# Patient Record
Sex: Male | Born: 2000 | Race: Black or African American | Hispanic: No | Marital: Single | Smoking: Former smoker
Health system: Southern US, Community
[De-identification: ages and names within clinical notes are randomized; demographics above are authoritative.]

---
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
## 2020-11-15 ENCOUNTER — Encounter (HOSPITAL_COMMUNITY): Payer: Self-pay

## 2020-11-15 ENCOUNTER — Other Ambulatory Visit: Payer: Self-pay

## 2020-11-15 ENCOUNTER — Emergency Department (HOSPITAL_COMMUNITY)
Admission: EM | Admit: 2020-11-15 | Discharge: 2020-11-15 | Disposition: A | Discharge status: Home / Self Care | Attending: Emergency Medicine | Admitting: Emergency Medicine

## 2020-11-15 DIAGNOSIS — J029 Acute pharyngitis, unspecified: Secondary | ICD-10-CM | POA: Diagnosis present

## 2020-11-15 DIAGNOSIS — J069 Acute upper respiratory infection, unspecified: Secondary | ICD-10-CM

## 2020-11-15 DIAGNOSIS — Z20822 Contact with and (suspected) exposure to covid-19: Secondary | ICD-10-CM | POA: Diagnosis not present

## 2020-11-15 LAB — RESP PANEL BY RT-PCR (FLU A&B, COVID) ARPGX2
Influenza A by PCR: NEGATIVE
Influenza B by PCR: NEGATIVE
SARS Coronavirus 2 by RT PCR: NEGATIVE

## 2020-11-15 NOTE — ED Provider Notes (Signed)
[facility] EMERGENCY DEPARTMENT Provider Note   CSN: [id number] Arrival date & time: 11/15/20  1320     History Chief Complaint  Patient presents with  . Generalized Body Aches    Dennis Phillips is a 20 y.o. male.  Patient c/o sore throat, mild body aches, occasional non prod cough in the past 1-2 days. Denies specific known ill contact or known strep, flu, mono, or covid exposure. Denies trouble breathing or swallowing. No chest pain or discomfort. No sob. No abd pain or nvd. No dysuria. No headache. No fever/chills. Also request std check, as recent new contact. Denies known std exposure. No penile discharge. Requests swab be sent.   The history is provided by the patient.       History reviewed. No pertinent past medical history.  There are no problems to display for this patient.   History reviewed. No pertinent surgical history.     History reviewed. No pertinent family history.  Social History   Tobacco Use  . Smoking status: Never Smoker  . Smokeless tobacco: Never Used  Vaping Use  . Vaping Use: Never used  Substance Use Topics  . Alcohol use: No  . Drug use: No    Home Medications Prior to Admission medications   Medication Sig Start Date End Date Taking? Authorizing Provider  HYDROcodone-acetaminophen (NORCO/VICODIN) 5-325 MG per tablet Take 1 tablet by mouth every 4 (four) hours as needed for pain. Patient not taking: Reported on 05/25/2016 03/05/13   Burgess Amor, PA-C  ibuprofen (ADVIL,MOTRIN) 200 MG tablet Take 200 mg by mouth every 6 (six) hours as needed for pain.    [provider]    Allergies    Patient has no known allergies.  Review of Systems   Review of Systems  Constitutional: Negative for chills and fever.  HENT: Positive for sore throat. Negative for sinus pain.   Eyes: Negative for redness.  Respiratory: Positive for cough. Negative for shortness of breath.   Cardiovascular: Negative for chest pain.  Gastrointestinal:  Negative for diarrhea and vomiting.  Genitourinary: Negative for dysuria, penile discharge and testicular pain.  Musculoskeletal: Positive for myalgias.  Skin: Negative for rash.  Neurological: Negative for headaches.    Physical Exam Updated Vital Signs BP 114/67 (BP Location: Right Arm)   Pulse 75   Temp 98.4 F (36.9 C) (Oral)   Resp 18   Ht 1.854 m (6\' 1" )   Wt 74.8 kg   SpO2 100%   BMI 21.77 kg/m   Physical Exam Vitals and nursing note reviewed.  Constitutional:      Appearance: Normal appearance. He is well-developed.  HENT:     Head: Atraumatic.     Right Ear: Tympanic membrane normal.     Left Ear: Tympanic membrane normal.     Nose: Nose normal.     Mouth/Throat:     Mouth: Mucous membranes are moist.     Pharynx: Oropharynx is clear. No oropharyngeal exudate or posterior oropharyngeal erythema.  Eyes:     General: No scleral icterus.    Conjunctiva/sclera: Conjunctivae normal.  Neck:     Trachea: No tracheal deviation.  Cardiovascular:     Rate and Rhythm: Normal rate and regular rhythm.     Pulses: Normal pulses.     Heart sounds: Normal heart sounds. No murmur heard. No friction rub. No gallop.   Pulmonary:     Effort: Pulmonary effort is normal. No accessory muscle usage or respiratory distress.  Breath sounds: Normal breath sounds.  Abdominal:     Palpations: Abdomen is soft.     Tenderness: There is no abdominal tenderness.  Genitourinary:    Comments: Normal external gu exam. No discharge.  Musculoskeletal:        General: No swelling.     Cervical back: Normal range of motion and neck supple. No rigidity.  Lymphadenopathy:     Cervical: No cervical adenopathy.  Skin:    General: Skin is warm and dry.     Findings: No rash.  Neurological:     Mental Status: He is alert.     Comments: Alert, speech clear.   Psychiatric:        Mood and Affect: Mood normal.     ED Results / Procedures / Treatments   Labs (all labs ordered are  listed, but only abnormal results are displayed) Labs Reviewed  RESP PANEL BY RT-PCR (FLU A&B, COVID) [id number]  GC/CHLAMYDIA PROBE AMP ([place]) NOT AT [facility]    EKG None  Radiology No results found.  Procedures Procedures   Medications Ordered in ED Medications - No data to display  ED Course  I have reviewed the triage vital signs and the nursing notes.  Pertinent labs & imaging results that were available during my care of the patient were reviewed by me and considered in my medical decision making (see chart for details).    MDM Rules/Calculators/A&P                         Pt with uri symptoms, no definite known covid exposure. Swab sent.   Dennis Phillips was evaluated in Emergency Department on 11/15/2020 for the symptoms described in the history of present illness. He was evaluated in the context of the global COVID-19 pandemic, which necessitated consideration that the patient might be at risk for infection with the SARS-CoV-2 virus that causes COVID-19. Institutional protocols and algorithms that pertain to the evaluation of patients at risk for COVID-19 are in a state of rapid change based on information released by regulatory bodies including the CDC and federal and state organizations. These policies and algorithms were followed during the patient's care in the ED.  Pt also requests urethral swab/std check - swab sent.   Pt appears stable for d/c.   Return precautions provided.    Final Clinical Impression(s) / ED Diagnoses Final diagnoses:  None    Rx / DC Orders ED Discharge Orders    None       Cathren Laine, MD 11/15/20 1431

## 2020-11-15 NOTE — Discharge Instructions (Addendum)
It was our pleasure to provide your ER care today - we hope that you feel better.  You may take acetaminophen or ibuprofen as need. You may use throat lozenges as need for symptom relief.   Your swabs were sent.  The STD tests should be resulted tomorrow - you may check MyChart or call for results.  The covid/flu test should be  resulted in the next 2-3 hours - check MyChart or call for result.   Return to ER if worse, new symptoms, trouble breathing, unable to swallow, high fevers, new/severe pain, or other concern.

## 2020-11-15 NOTE — ED Triage Notes (Signed)
Pt to er, pt states that he has flu like symptoms and he is sexually active, states that he wants to get checked for a std, states that he has no discharge, pt foul smelling urine, and no pain with urination.  Pt states that his general body aches, sore throat and muscle aches started yesterday after leaving the gym, pt denies cough, resps even and unlabored, talking in full sentences, no nasal drainage noted.

## 2020-11-16 LAB — GC/CHLAMYDIA PROBE AMP (~~LOC~~) NOT AT ARMC
Chlamydia: NEGATIVE
Comment: NEGATIVE
Comment: NORMAL
Neisseria Gonorrhea: NEGATIVE

## 2020-12-04 DIAGNOSIS — R109 Unspecified abdominal pain: Secondary | ICD-10-CM | POA: Insufficient documentation

## 2020-12-04 DIAGNOSIS — N433 Hydrocele, unspecified: Secondary | ICD-10-CM | POA: Insufficient documentation

## 2020-12-04 DIAGNOSIS — Y9367 Activity, basketball: Secondary | ICD-10-CM | POA: Diagnosis not present

## 2020-12-04 DIAGNOSIS — N50812 Left testicular pain: Secondary | ICD-10-CM | POA: Diagnosis not present

## 2020-12-04 DIAGNOSIS — N442 Benign cyst of testis: Secondary | ICD-10-CM | POA: Insufficient documentation

## 2020-12-04 DIAGNOSIS — X58XXXA Exposure to other specified factors, initial encounter: Secondary | ICD-10-CM | POA: Diagnosis not present

## 2020-12-05 ENCOUNTER — Other Ambulatory Visit (HOSPITAL_COMMUNITY)

## 2020-12-05 ENCOUNTER — Other Ambulatory Visit: Payer: Self-pay

## 2020-12-05 ENCOUNTER — Emergency Department (HOSPITAL_COMMUNITY)

## 2020-12-05 ENCOUNTER — Encounter (HOSPITAL_COMMUNITY): Payer: Self-pay | Admitting: Emergency Medicine

## 2020-12-05 ENCOUNTER — Emergency Department (HOSPITAL_COMMUNITY)
Admission: EM | Admit: 2020-12-05 | Discharge: 2020-12-05 | Disposition: A | Discharge status: Home / Self Care | Source: Home / Self Care | Attending: Emergency Medicine | Admitting: Emergency Medicine

## 2020-12-05 ENCOUNTER — Emergency Department (HOSPITAL_COMMUNITY)
Admission: EM | Admit: 2020-12-05 | Discharge: 2020-12-05 | Disposition: A | Discharge status: Home / Self Care | Attending: Emergency Medicine | Admitting: Emergency Medicine

## 2020-12-05 DIAGNOSIS — N442 Benign cyst of testis: Secondary | ICD-10-CM | POA: Insufficient documentation

## 2020-12-05 DIAGNOSIS — N433 Hydrocele, unspecified: Secondary | ICD-10-CM | POA: Insufficient documentation

## 2020-12-05 DIAGNOSIS — L729 Follicular cyst of the skin and subcutaneous tissue, unspecified: Secondary | ICD-10-CM

## 2020-12-05 DIAGNOSIS — R109 Unspecified abdominal pain: Secondary | ICD-10-CM | POA: Insufficient documentation

## 2020-12-05 DIAGNOSIS — N50812 Left testicular pain: Secondary | ICD-10-CM

## 2020-12-05 LAB — URINALYSIS, ROUTINE W REFLEX MICROSCOPIC
Bilirubin Urine: NEGATIVE
Bilirubin Urine: NEGATIVE
Glucose, UA: NEGATIVE mg/dL
Glucose, UA: NEGATIVE mg/dL
Hgb urine dipstick: NEGATIVE
Hgb urine dipstick: NEGATIVE
Ketones, ur: NEGATIVE mg/dL
Ketones, ur: NEGATIVE mg/dL
Leukocytes,Ua: NEGATIVE
Leukocytes,Ua: NEGATIVE
Nitrite: NEGATIVE
Nitrite: NEGATIVE
Protein, ur: NEGATIVE mg/dL
Protein, ur: NEGATIVE mg/dL
Specific Gravity, Urine: 1.018 (ref 1.005–1.030)
Specific Gravity, Urine: 1.016 (ref 1.005–1.030)
pH: 6 (ref 5.0–8.0)
pH: 7 (ref 5.0–8.0)

## 2020-12-05 MED ORDER — OXYCODONE-ACETAMINOPHEN 5-325 MG PO TABS
2.0000 | ORAL_TABLET | Freq: Once | ORAL | Status: AC
  Administered 2020-12-05: 2 via ORAL
  Filled 2020-12-05: qty 2

## 2020-12-05 MED ORDER — IBUPROFEN 600 MG PO TABS: 600.0000 mg | ORAL_TABLET | Freq: Four times a day (QID) | ORAL | 0 refills | Status: DC | PRN

## 2020-12-05 MED ORDER — SIMETHICONE 80 MG PO CHEW
80.0000 mg | CHEWABLE_TABLET | Freq: Once | ORAL | Status: AC
  Administered 2020-12-05: 80 mg via ORAL
  Filled 2020-12-05: qty 1

## 2020-12-05 MED ORDER — KETOROLAC TROMETHAMINE 60 MG/2ML IM SOLN
30.0000 mg | Freq: Once | INTRAMUSCULAR | Status: AC
  Administered 2020-12-05: 30 mg via INTRAMUSCULAR
  Filled 2020-12-05: qty 2

## 2020-12-05 NOTE — ED Triage Notes (Signed)
Pt states he was playing basketball and thinks he might have gotten hit in his L testicle. Pt states he has had pain off and on all day but that it has gotten worse. Pt states he is also now having mid abdominal pain with it.

## 2020-12-05 NOTE — ED Provider Notes (Signed)
[facility] EMERGENCY DEPARTMENT Provider Note   CSN: [id number] Arrival date & time: 12/04/20  2349     History Chief Complaint  Patient presents with  . Testicle Pain    Dennis Phillips is a 20 y.o. male.  20 year old male that presents the emerge department today for testicular pain.  States that he was playing basketball today and after he got done he noticed that his left testicle hurt.  He states that it was not severe in nature.  Went home and had a bowel movement and afterwards the testicle stopped hurting.  Patient went to sleep and he woke up to go to the bathroom and while he was awake he noticed that his left testicle is hurting again so he came here for further evaluation.  No urinary symptoms.  No fever.  No dysuria.  No history of the same.  Patient does not recall being hit or twisted his testicle in any [place] while playing basketball.  Does not recall overexerting himself.  No changes in bowel movements.   Testicle Pain       History reviewed. No pertinent past medical history.  There are no problems to display for this patient.   History reviewed. No pertinent surgical history.     History reviewed. No pertinent family history.  Social History   Tobacco Use  . Smoking status: Never Smoker  . Smokeless tobacco: Never Used  Vaping Use  . Vaping Use: Never used  Substance Use Topics  . Alcohol use: No  . Drug use: No    Home Medications Prior to Admission medications   Medication Sig Start Date End Date Taking? Authorizing Provider  HYDROcodone-acetaminophen (NORCO/VICODIN) 5-325 MG per tablet Take 1 tablet by mouth every 4 (four) hours as needed for pain. Patient not taking: Reported on 05/25/2016 03/05/13   Burgess Amor, PA-C  ibuprofen (ADVIL,MOTRIN) 200 MG tablet Take 200 mg by mouth every 6 (six) hours as needed for pain.    [provider]    Allergies    Patient has no known allergies.  Review of Systems   Review of Systems   Genitourinary: Positive for testicular pain.  All other systems reviewed and are negative.   Physical Exam Updated Vital Signs BP 120/74   Pulse (!) 56   Temp 98 F (36.7 C)   Resp 17   Ht 6\' 1"  (1.854 m)   Wt 74.8 kg   SpO2 99%   BMI 21.77 kg/m   Physical Exam Vitals and nursing note reviewed.  Constitutional:      Appearance: He is well-developed.  HENT:     Head: Normocephalic and atraumatic.     Mouth/Throat:     Mouth: Mucous membranes are moist.     Pharynx: Oropharynx is clear.  Eyes:     Pupils: Pupils are equal, round, and reactive to light.  Cardiovascular:     Rate and Rhythm: Normal rate.  Pulmonary:     Effort: Pulmonary effort is normal. No respiratory distress.  Abdominal:     General: Abdomen is flat. There is no distension.  Genitourinary:    Testes: Normal. Cremasteric reflex is present.        Right: Mass, tenderness, swelling, testicular hydrocele or varicocele not present.        Left: Mass, tenderness, swelling, testicular hydrocele or varicocele not present.     Epididymis:     Right: Normal.     Left: Normal.     Comments: Chaperoned by  nurse - Ginger Knot on perineal area non-erythematous, non tender, non warm, non fluctuant. Musculoskeletal:        General: Normal range of motion.     Cervical back: Normal range of motion.  Skin:    General: Skin is warm and dry.     Coloration: Skin is not jaundiced or pale.  Neurological:     General: No focal deficit present.     Mental Status: He is alert.     ED Results / Procedures / Treatments   Labs (all labs ordered are listed, but only abnormal results are displayed) Labs Reviewed  URINE CULTURE  URINALYSIS, ROUTINE W REFLEX MICROSCOPIC    EKG None  Radiology No results found.  Procedures Procedures   Medications Ordered in ED Medications  ketorolac (TORADOL) injection 30 mg (30 mg Intramuscular Given 12/05/20 0216)  oxyCODONE-acetaminophen (PERCOCET/ROXICET) 5-325 MG per  tablet 2 tablet (2 tablets Oral Given 12/05/20 0216)  simethicone (MYLICON) chewable tablet 80 mg (80 mg Oral Given 12/05/20 0216)    ED Course  I have reviewed the triage vital signs and the nursing notes.  Pertinent labs & imaging results that were available during my care of the patient were reviewed by me and considered in my medical decision making (see chart for details).    MDM Rules/Calculators/A&P                          No evidence of epididymitis, hydrocele, varicocele, hernia or testicular torsion.  outpatient [place] for the abnormal area in perineum, testicular US to ensure no e/o mass or other acute causes but not needed for torsion, so will get done as outpatient.   Final Clinical Impression(s) / ED Diagnoses Final diagnoses:  Pain in left testicle    Rx / DC Orders ED Discharge Orders         Ordered    US Scrotum        12/05/20 0345    US PELVIC DOPPLER (TORSION R/O OR MASS ARTERIAL FLOW)        12/05/20 0345           Awilda Covin, Barbara Cower, MD 12/05/20 [phone]

## 2020-12-05 NOTE — Discharge Instructions (Signed)
You can take ibuprofen 600 mg every 6 hours as needed for pain for the next 5-7 days.

## 2020-12-05 NOTE — ED Triage Notes (Signed)
Pt states he was playing basketball and thinks he might have gotten hit in his L testicle. Pt states he has had pain off and on all day but that it has gotten worse. Pt states he is also now having mid abdominal pain with it.  Pt wants sti screening and also has a "bump" in his groin area

## 2020-12-05 NOTE — ED Provider Notes (Signed)
[facility] EMERGENCY DEPARTMENT Provider Note   CSN: [id number] Arrival date & time: 12/05/20  1830     History Chief Complaint  Patient presents with  . Groin Injury    Dennis Phillips is a 21 y.o. male 20 year old male who presents emergency department with chief complaint of left testicle pain.  He was seen yesterday for the same during the evening was unable to obtain an ultrasound of the scrotum.  Patient complains of pain in the left testicle that began during a basketball game yesterday.  He states that it progressively worsened radiating up into his abdomen.  He took Advil and went to sleep and woke up and pain was still there.  He made a bowel movement and it relieved his pain somewhat but it came back.  He describes the pain is constant, aching.  It is now 4 out of 5 but yesterday was much more severe.  He also has a bump on the perineal area which is tender.  He noticed that just yesterday as well.  He denies hematuria, flank pain, nausea, vomiting.  HPI     History reviewed. No pertinent past medical history.  There are no problems to display for this patient.   History reviewed. No pertinent surgical history.     History reviewed. No pertinent family history.  Social History   Tobacco Use  . Smoking status: Never Smoker  . Smokeless tobacco: Never Used  Vaping Use  . Vaping Use: Never used  Substance Use Topics  . Alcohol use: No  . Drug use: No    Home Medications Prior to Admission medications   Medication Sig Start Date End Date Taking? Authorizing Provider  HYDROcodone-acetaminophen (NORCO/VICODIN) 5-325 MG per tablet Take 1 tablet by mouth every 4 (four) hours as needed for pain. Patient not taking: Reported on 05/25/2016 03/05/13   Burgess Amor, PA-C  ibuprofen (ADVIL,MOTRIN) 200 MG tablet Take 200 mg by mouth every 6 (six) hours as needed for pain.    [provider]    Allergies    Patient has no known allergies.  Review of Systems    Review of Systems Ten systems reviewed and are negative for acute change, except as noted in the HPI.   Physical Exam Updated Vital Signs BP 115/70 (BP Location: Right Arm)   Pulse (!) 45   Temp 98.2 F (36.8 C) (Oral)   Resp 18   Ht 6\' 1"  (1.854 m)   Wt 74.8 kg   SpO2 100%   BMI 21.77 kg/m   Physical Exam Vitals and nursing note reviewed.  Constitutional:      General: He is not in acute distress.    Appearance: He is well-developed. He is not diaphoretic.  HENT:     Head: Normocephalic and atraumatic.  Eyes:     General: No scleral icterus.    Conjunctiva/sclera: Conjunctivae normal.  Cardiovascular:     Rate and Rhythm: Normal rate and regular rhythm.     Heart sounds: Normal heart sounds.  Pulmonary:     Effort: Pulmonary effort is normal. No respiratory distress.     Breath sounds: Normal breath sounds.  Abdominal:     Palpations: Abdomen is soft.     Tenderness: There is abdominal tenderness in the left lower quadrant.  Genitourinary:    Comments: Exam chaperoned.  Normal male anatomy was circumcised penis.  No significant tenderness to palpation of the testicle, no palpable hernia.  No penile discharge, normal cremasteric reflex.  Palpable cyst on the perineum. Musculoskeletal:     Cervical back: Normal range of motion and neck supple.  Skin:    General: Skin is warm and dry.  Neurological:     Mental Status: He is alert.  Psychiatric:        Behavior: Behavior normal.     ED Results / Procedures / Treatments   Labs (all labs ordered are listed, but only abnormal results are displayed) Labs Reviewed  URINALYSIS, ROUTINE W REFLEX MICROSCOPIC  GC/CHLAMYDIA PROBE AMP ([place]) NOT AT [facility]    EKG None  Radiology US SCROTUM W/DOPPLER  Result Date: 12/05/2020 CLINICAL DATA:  Left testicle pain after falling playing basketball, lump for 2 weeks EXAM: SCROTAL ULTRASOUND DOPPLER ULTRASOUND OF THE TESTICLES TECHNIQUE: Complete ultrasound examination of  the testicles, epididymis, and other scrotal structures was performed. Color and spectral Doppler ultrasound were also utilized to evaluate blood flow to the testicles. COMPARISON:  None. FINDINGS: Right testicle Measurements: 5.6 x 3.7 x 3.1 cm. No mass or microlithiasis visualized. Left testicle Measurements: 4.9 x 2.1 x20.9 cm. No mass or microlithiasis visualized. Right epididymis:  Normal in size and appearance. Left epididymis:  Normal in size and appearance. Hydrocele:  Small bilateral hydroceles. Varicocele:  None visualized. Other: There is a 0.7 cm superficial subcutaneous lesion at the posterior aspect of the scrotum. Pulsed Doppler interrogation of both testes demonstrates normal low resistance arterial and venous waveforms bilaterally. IMPRESSION: 1. Normal ultrasound appearance of the bilateral testicles. Normal arterial and venous Doppler flow bilaterally. 2. Small bilateral hydroceles. 3. There is a 0.7 cm superficial subcutaneous lesion at the posterior aspect of the scrotum, likely a small inclusion cyst, and almost certainly benign. Electronically Signed   By: Lauralyn Primes M.D.   On: 12/05/2020 19:45    Procedures Procedures   Medications Ordered in ED Medications - No data to display  ED Course  I have reviewed the triage vital signs and the nursing notes.  Pertinent labs & imaging results that were available during my care of the patient were reviewed by me and considered in my medical decision making (see chart for details).  Clinical Course as of 12/05/20 2312  Sat Dec 05, 2020  2121 This is a 20 year old male presenting to emergency department with left lower abdominal discomfort and left testicular pain.  He says is been intermittent for a few days, but worsened while at basketball practice.  He does not recall being directly struck in the scrotum.  He does report that he had some relief after a bowel movement.  On exam he is a very small cyst in his left outer scrotum, had a  scrotal ultrasound which is largely reassuring.  No evidence of epididymitis or torsion on the ultrasound or per his exam.  His abdominal exam is completely benign.  There is no tenderness at all in the right lower quadrant, no Rovsing or psoas sign to suggest appendicitis.  UA shows no sign of infection.  He does have a history of kidney stones in the past, and I suspect he may have passed a stone.  At this point, he has very minimal symptoms, strongly prefers to go home.  I think this is reasonable.  We will discharge. [MT]    Clinical Course User Index [MT] Trifan, Kermit Balo, MD   MDM Rules/Calculators/A&P                         20 year old male here  with complaint of testicle pain.  I ordered and reviewed testicular ultrasound which shows no evidence of epididymitis, torsion.  UA shows no signs of infection.  Initially I ordered a CT renal stone study however patient is feeling improved and has minimal symptoms.  Patient would like to be discharged at this time.  Seen and shared visit with Dr. Renaye Rakers.  Discussed outpatient follow-up.  Appears appropriate for discharge.  GC chlamydia pending  Final Clinical Impression(s) / ED Diagnoses Final diagnoses:  Scrotal cyst  Abdominal discomfort  Hydrocele, unspecified hydrocele type    Rx / DC Orders ED Discharge Orders    None       Leveon, Pelzer, PA-C 12/05/20 2314    Terald Sleeper, MD 12/06/20 [phone]

## 2020-12-06 LAB — URINE CULTURE: Culture: NO GROWTH

## 2020-12-07 LAB — GC/CHLAMYDIA PROBE AMP (~~LOC~~) NOT AT ARMC
Chlamydia: NEGATIVE
Comment: NEGATIVE
Comment: NORMAL
Neisseria Gonorrhea: NEGATIVE

## 2021-04-15 ENCOUNTER — Other Ambulatory Visit: Payer: Self-pay

## 2021-04-15 ENCOUNTER — Encounter (HOSPITAL_COMMUNITY): Payer: Self-pay | Admitting: *Deleted

## 2021-04-15 ENCOUNTER — Emergency Department (HOSPITAL_COMMUNITY)
Admission: EM | Admit: 2021-04-15 | Discharge: 2021-04-15 | Disposition: A | Discharge status: Home / Self Care | Attending: Emergency Medicine | Admitting: Emergency Medicine

## 2021-04-15 DIAGNOSIS — Z7251 High risk heterosexual behavior: Secondary | ICD-10-CM | POA: Insufficient documentation

## 2021-04-15 NOTE — Discharge Instructions (Addendum)
Follow-up in your MyChart account for your test results.  Go to the health department for any treatment or further testing needs.

## 2021-04-15 NOTE — ED Provider Notes (Signed)
[facility] EMERGENCY DEPARTMENT Provider Note   CSN: [id number] Arrival date & time: 04/15/21  1407     History Chief Complaint  Patient presents with   SEXUALLY TRANSMITTED DISEASE    Dennis Phillips is a 20 y.o. male.  20 year old male with request for STD test after his girlfriend called him today and told him she tested positive for chlamydia. Patient denies any complaints otherwise.       History reviewed. No pertinent past medical history.  There are no problems to display for this patient.   History reviewed. No pertinent surgical history.     No family history on file.  Social History   Tobacco Use   Smoking status: Never   Smokeless tobacco: Never  Vaping Use   Vaping Use: Never used  Substance Use Topics   Alcohol use: No   Drug use: No    Home Medications Prior to Admission medications   Medication Sig Start Date End Date Taking? Authorizing Provider  ibuprofen (ADVIL,MOTRIN) 200 MG tablet Take 200 mg by mouth every 6 (six) hours as needed for pain.   Yes [provider]  HYDROcodone-acetaminophen (NORCO/VICODIN) 5-325 MG per tablet Take 1 tablet by mouth every 4 (four) hours as needed for pain. Patient not taking: No sig reported 03/05/13   Burgess Amor, PA-C  ibuprofen (ADVIL) 600 MG tablet Take 1 tablet (600 mg total) by mouth every 6 (six) hours as needed for up to 30 doses for mild pain or moderate pain. Patient not taking: Reported on 04/15/2021 12/05/20   Terald Sleeper, MD    Allergies    Patient has no known allergies.  Review of Systems   Review of Systems  Constitutional:  Negative for fever.  Gastrointestinal:  Negative for abdominal pain.  Genitourinary:  Negative for penile discharge, penile pain, penile swelling, scrotal swelling and testicular pain.  Skin:  Negative for rash and wound.  Allergic/Immunologic: Negative for immunocompromised state.   Physical Exam Updated Vital Signs BP 115/66   Pulse (!) 48   Temp  98.2 F (36.8 C)   Resp 20   Ht 6\' 1"  (1.854 m)   Wt 77.1 kg   SpO2 100%   BMI 22.43 kg/m   Physical Exam Vitals and nursing note reviewed.  Constitutional:      General: He is not in acute distress.    Appearance: He is well-developed. He is not diaphoretic.  HENT:     Head: Normocephalic and atraumatic.  Cardiovascular:     Rate and Rhythm: Normal rate and regular rhythm.     Heart sounds: Normal heart sounds.  Pulmonary:     Effort: Pulmonary effort is normal.     Breath sounds: Normal breath sounds.  Skin:    General: Skin is warm and dry.  Neurological:     Mental Status: He is alert and oriented to person, place, and time.  Psychiatric:        Behavior: Behavior normal.    ED Results / Procedures / Treatments   Labs (all labs ordered are listed, but only abnormal results are displayed) Labs Reviewed  GC/CHLAMYDIA PROBE AMP ([place]) NOT AT [facility]    EKG None  Radiology No results found.  Procedures Procedures   Medications Ordered in ED Medications - No data to display  ED Course  I have reviewed the triage vital signs and the nursing notes.  Pertinent labs & imaging results that were available during my care of  the patient were reviewed by me and considered in my medical decision making (see chart for details).  Clinical Course as of 04/15/21 1702  Thu Apr 15, 2021  [id number] 20 year old male with request for STD test without symptoms as above. Negative test x 2 earlier this year. Will send urine, advised to monitor MyChart for his results and present to the health department for treatment if needed. Also recommend health department for HIV/RPR if desired.  [LM]    Clinical Course User Index [LM] Alden Hipp   MDM Rules/Calculators/A&P                           Final Clinical Impression(s) / ED Diagnoses Final diagnoses:  High risk sexual behavior, unspecified type    Rx / DC Orders ED Discharge Orders     None        Jeannie Fend, PA-C 04/15/21 1702    Terald Sleeper, MD 04/16/21 [phone]

## 2021-04-15 NOTE — ED Triage Notes (Signed)
Possible std exposure

## 2021-04-16 LAB — GC/CHLAMYDIA PROBE AMP (~~LOC~~) NOT AT ARMC
Chlamydia: NEGATIVE
Comment: NEGATIVE
Comment: NORMAL
Neisseria Gonorrhea: NEGATIVE

## 2021-05-05 ENCOUNTER — Emergency Department (HOSPITAL_COMMUNITY)

## 2021-05-05 ENCOUNTER — Encounter (HOSPITAL_COMMUNITY): Payer: Self-pay

## 2021-05-05 ENCOUNTER — Emergency Department (HOSPITAL_COMMUNITY)
Admission: EM | Admit: 2021-05-05 | Discharge: 2021-05-05 | Disposition: A | Discharge status: Home / Self Care | Attending: Emergency Medicine | Admitting: Emergency Medicine

## 2021-05-05 ENCOUNTER — Other Ambulatory Visit: Payer: Self-pay

## 2021-05-05 DIAGNOSIS — S76212A Strain of adductor muscle, fascia and tendon of left thigh, initial encounter: Secondary | ICD-10-CM | POA: Insufficient documentation

## 2021-05-05 DIAGNOSIS — S79922A Unspecified injury of left thigh, initial encounter: Secondary | ICD-10-CM | POA: Diagnosis present

## 2021-05-05 DIAGNOSIS — X58XXXA Exposure to other specified factors, initial encounter: Secondary | ICD-10-CM | POA: Diagnosis not present

## 2021-05-05 DIAGNOSIS — R1030 Lower abdominal pain, unspecified: Secondary | ICD-10-CM | POA: Insufficient documentation

## 2021-05-05 NOTE — ED Triage Notes (Signed)
Pt states during Fit test this am was doing his 1/2 miles sprint and heard a pop and started having pain near left groin area. Patient ambulatory to traige

## 2021-05-05 NOTE — ED Provider Notes (Signed)
Emergency Medicine Provider Triage Evaluation Note  Dennis Phillips , a 20 y.o. male  was evaluated in triage.  Pt complains of left groin pain, worse with hip flexion, felt a pop today while doing PT. No testicle pain. No pain/injury prior.  Review of Systems  Positive: Hip pain Negative: Testicle pain  Physical Exam  BP (!) 122/52 (BP Location: Right Arm)   Pulse (!) 54   Temp 98.5 F (36.9 C) (Oral)   Resp 16   Ht 6\' 1"  (1.854 m)   Wt 77 kg   SpO2 100%   BMI 22.40 kg/m  Gen:   Awake, no distress   Resp:  Normal effort  MSK:   Moves extremities without difficulty  Other:    Medical Decision Making  Medically screening exam initiated at 11:49 AM.  Appropriate orders placed.  was informed that the remainder of the evaluation will be completed by another provider, this initial triage assessment does not replace that evaluation, and the importance of remaining in the ED until their evaluation is complete.     Geralynn Rile, PA-C 05/05/21 1150    07/05/21, MD 05/05/21 [phone]

## 2021-05-05 NOTE — ED Provider Notes (Signed)
[facility]-EMERGENCY DEPT Provider Note   CSN: [id number] Arrival date & time: 05/05/21  1110     History Chief Complaint  Patient presents with   Groin Pain    Dennis Phillips is a 20 y.o. male with no past medical history.  Presents to the emergency department with a chief complaint of left groin pain.  Patient reports that he "felt and heard a pop," while running at approximately 0 800 this morning.  Patient reports that he has had constant pain to left groin since then.  Patient reports that pain is worse with movement.  Pain is improved at rest.  At present patient rates pain 5/10 on the pain scale.     Groin Pain      History reviewed. No pertinent past medical history.  There are no problems to display for this patient.   History reviewed. No pertinent surgical history.     History reviewed. No pertinent family history.  Social History   Tobacco Use   Smoking status: Never   Smokeless tobacco: Never  Vaping Use   Vaping Use: Never used  Substance Use Topics   Alcohol use: No   Drug use: No    Home Medications Prior to Admission medications   Medication Sig Start Date End Date Taking? Authorizing Provider  HYDROcodone-acetaminophen (NORCO/VICODIN) 5-325 MG per tablet Take 1 tablet by mouth every 4 (four) hours as needed for pain. Patient not taking: No sig reported 03/05/13   Burgess Amor, PA-C  ibuprofen (ADVIL) 600 MG tablet Take 1 tablet (600 mg total) by mouth every 6 (six) hours as needed for up to 30 doses for mild pain or moderate pain. Patient not taking: Reported on 04/15/2021 12/05/20   Terald Sleeper, MD  ibuprofen (ADVIL,MOTRIN) 200 MG tablet Take 200 mg by mouth every 6 (six) hours as needed for pain.    [provider]    Allergies    Patient has no known allergies.  Review of Systems   Review of Systems  Constitutional:  Negative for chills and fever.  Genitourinary:  Negative for difficulty urinating.   Musculoskeletal:  Positive for arthralgias and myalgias. Negative for back pain and neck pain.  Skin:  Negative for color change, pallor, rash and wound.  Allergic/Immunologic: Negative for immunocompromised state.  Neurological:  Negative for weakness and numbness.   Physical Exam Updated Vital Signs BP (!) 122/52 (BP Location: Right Arm)   Pulse (!) 54   Temp 98.5 F (36.9 C) (Oral)   Resp 16   Ht 6\' 1"  (1.854 m)   Wt 77 kg   SpO2 100%   BMI 22.40 kg/m   Physical Exam Vitals and nursing note reviewed.  Constitutional:      General: He is not in acute distress.    Appearance: He is not ill-appearing, toxic-appearing or diaphoretic.  HENT:     Head: Normocephalic.  Eyes:     General: No scleral icterus.       Right eye: No discharge.        Left eye: No discharge.  Cardiovascular:     Rate and Rhythm: Normal rate.  Pulmonary:     Effort: Pulmonary effort is normal.  Musculoskeletal:     Right hip: Normal.     Left hip: Tenderness present. No deformity, lacerations, bony tenderness or crepitus. Decreased range of motion.     Left upper leg: No tenderness or bony tenderness.     Left knee: No bony  tenderness. No tenderness.     Left lower leg: Normal.     Comments: Patient has tenderness to left groin.  No wounds, ecchymosis, or rash noted.  Pelvis stable.  Patient able to stand and ambulate without difficulty.  Patient has increased pain with left hip range of motion.  Decreased range of motion to left hip secondary to complaints of pain.  No midline tenderness, thoracic, lumbar spine.  Skin:    General: Skin is warm and dry.  Neurological:     General: No focal deficit present.     Mental Status: He is alert.  Psychiatric:        Behavior: Behavior is cooperative.    ED Results / Procedures / Treatments   Labs (all labs ordered are listed, but only abnormal results are displayed) Labs Reviewed - No data to display  EKG None  Radiology DG Hip Unilat With  Pelvis 2-3 Views Left  Result Date: 05/05/2021 CLINICAL DATA:  Hip pain during physical therapy.  No injury. EXAM: DG HIP (WITH OR WITHOUT PELVIS) 2-3V LEFT COMPARISON:  None. FINDINGS: Hip joint space is normal. No degenerative changes. Sacroiliac joints are patent. IMPRESSION: Negative. Electronically Signed   By: Leanna Battles M.D.   On: 05/05/2021 12:43    Procedures Procedures   Medications Ordered in ED Medications - No data to display  ED Course  I have reviewed the triage vital signs and the nursing notes.  Pertinent labs & imaging results that were available during my care of the patient were reviewed by me and considered in my medical decision making (see chart for details).    MDM Rules/Calculators/A&P                           Alert 20 year old male no acute distress, nontoxic appearing.  Presents to the ED with chief complaint of left groin pain.  X-ray imaging obtained while patient was in triage shows no acute osseous abnormality.  Patient has tenderness to left groin on exam.  Increased pain with left hip range of motion, decreased range of motion secondary to complaints of pain.  Suspect that patient has muscle strain.  Patient given information to use over-the-counter pain medication.  Patient to follow-up with orthopedic provider in outpatient setting.  Discussed results, findings, treatment and follow up. Patient advised of return precautions. Patient verbalized understanding and agreed with plan.   Final Clinical Impression(s) / ED Diagnoses Final diagnoses:  Inguinal strain, left, initial encounter    Rx / DC Orders ED Discharge Orders     None        Berneice Heinrich 05/05/21 1346    Arby Barrette, MD 05/05/21 1515

## 2021-05-05 NOTE — Discharge Instructions (Signed)
You came to the emergency department today to have your groin pain evaluated.  Your physical exam was reassuring.  The x-ray of your hip showed no broken bones or dislocations.  You likely have a strained muscle to your groin.  Please read attached paperwork on muscle strain.  I have given you information to follow-up with a orthopedic provider please call to schedule a follow-up appointment.  Please take Ibuprofen (Advil, motrin) and Tylenol (acetaminophen) to relieve your pain.    You may take up to 600 MG (3 pills) of normal strength ibuprofen every 8 hours as needed.   You make take tylenol, up to 1,000 mg (two extra strength pills) every 8 hours as needed.   It is safe to take ibuprofen and tylenol at the same time as they work differently.   Do not take more than 3,000 mg tylenol in a 24 hour period (not more than one dose every 8 hours.  Please check all medication labels as many medications such as pain and cold medications may contain tylenol.  Do not drink alcohol while taking these medications.  Do not take other NSAID'S while taking ibuprofen (such as aleve or naproxen).  Please take ibuprofen with food to decrease stomach upset.  Get help right away if: You have numbness or tingling in the injured area. You lose a lot of strength in the injured area.

## 2022-01-14 IMAGING — US US SCROTUM W/ DOPPLER COMPLETE
1 series · 13 of 25 positions shown · non-contrast
Comparison: None.

CLINICAL DATA: Left testicle pain after falling playing basketball,
lump for 2 weeks

EXAM:
SCROTAL ULTRASOUND
DOPPLER ULTRASOUND OF THE TESTICLES
TECHNIQUE: Complete ultrasound examination of the testicles, epididymis, and
other scrotal structures was performed. Color and spectral Doppler
ultrasound were also utilized to evaluate blood flow to the
testicles.

[Series 1: us scrotum w/doppler · 13 of 58 slices shown]
[im 1/58]
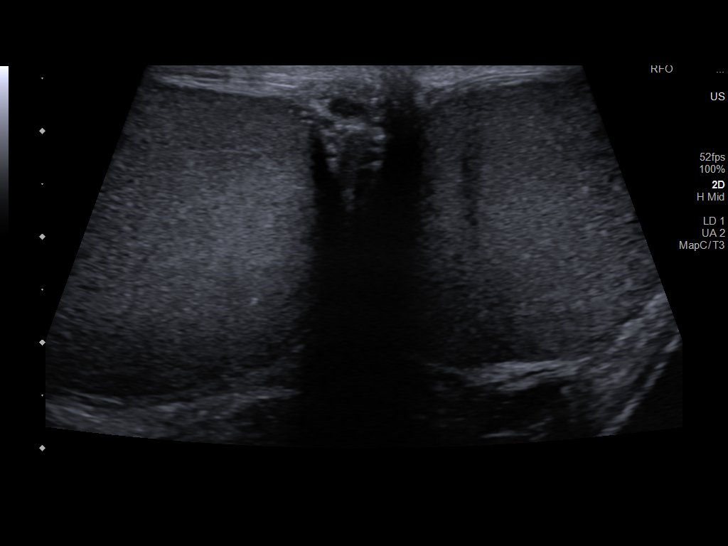
[im 5/58]
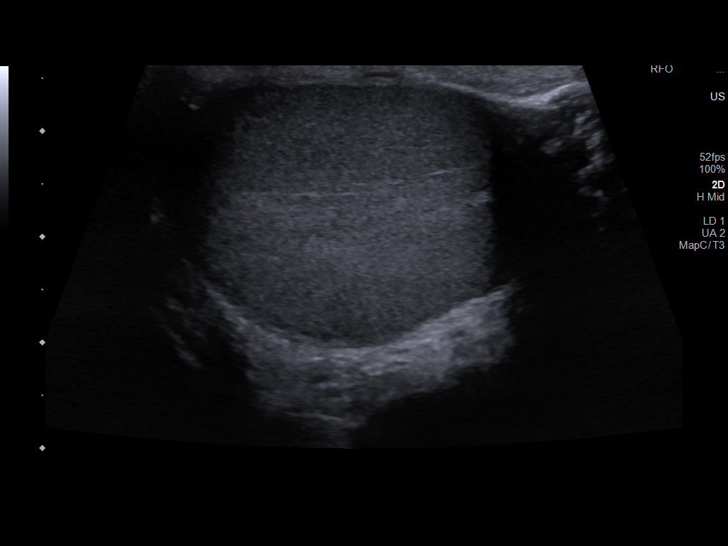
[im 10/58]
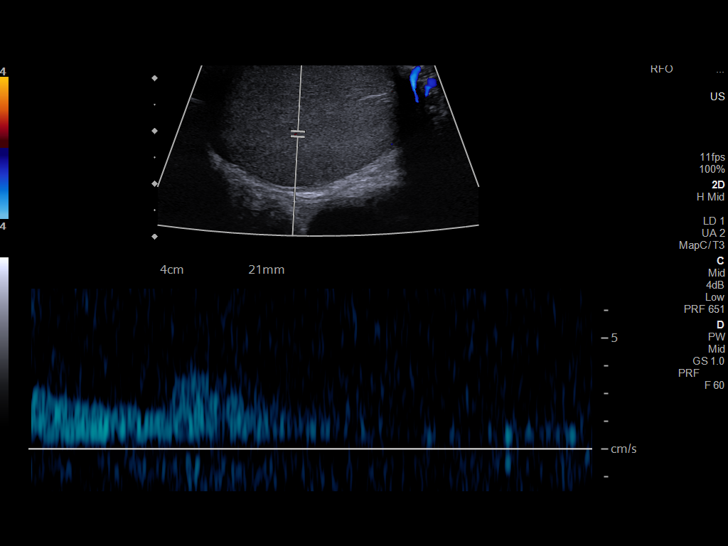
[im 15/58]
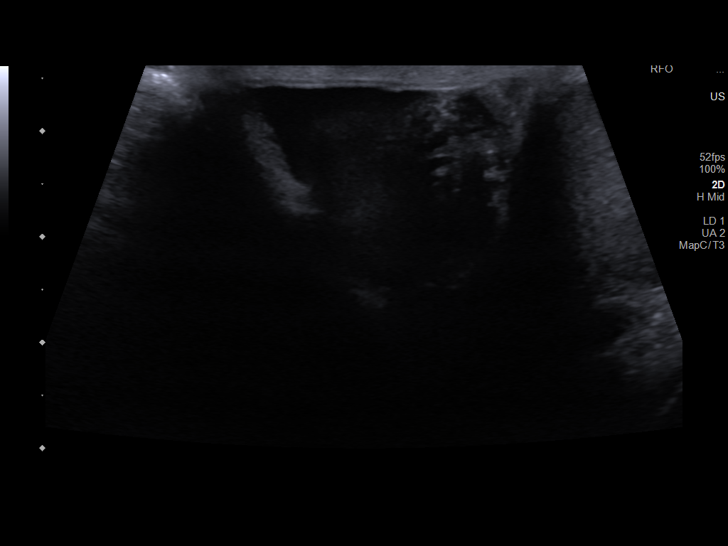
[im 20/58]
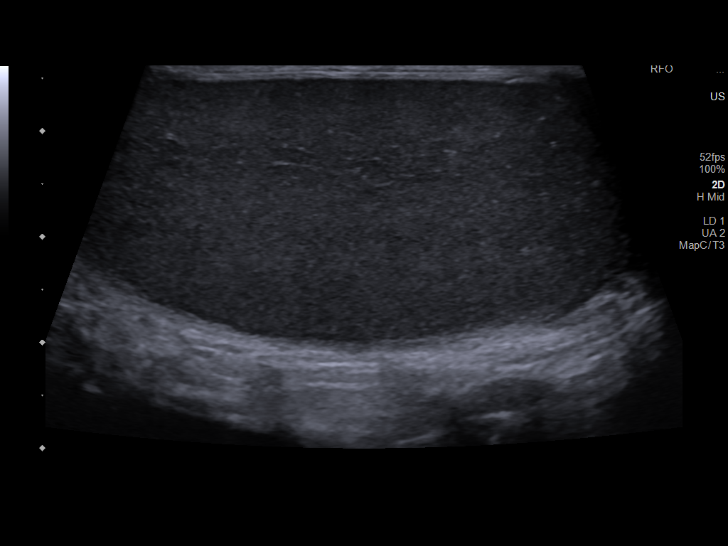
[im 24/58]
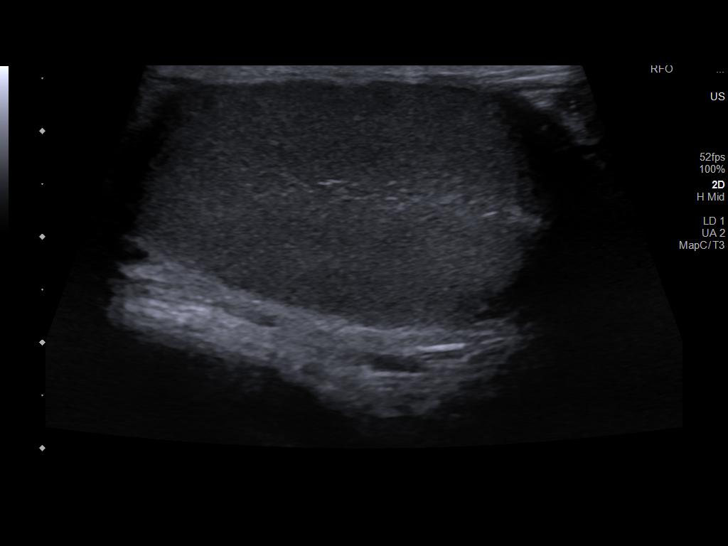
[im 29/58]
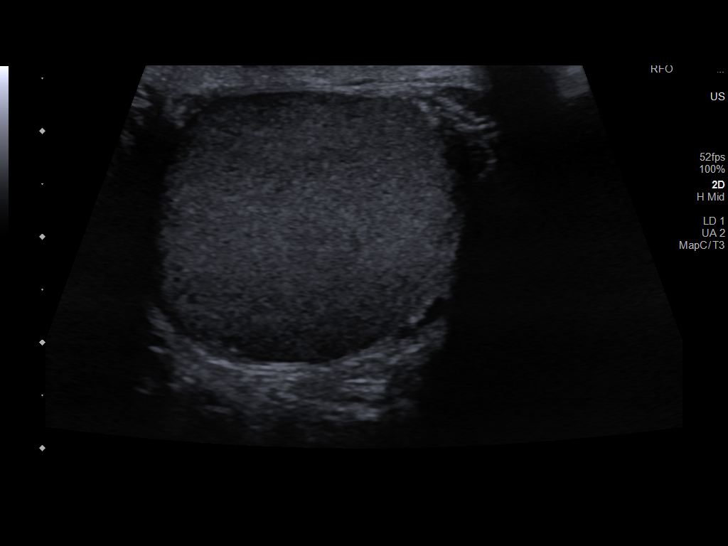
[im 34/58]
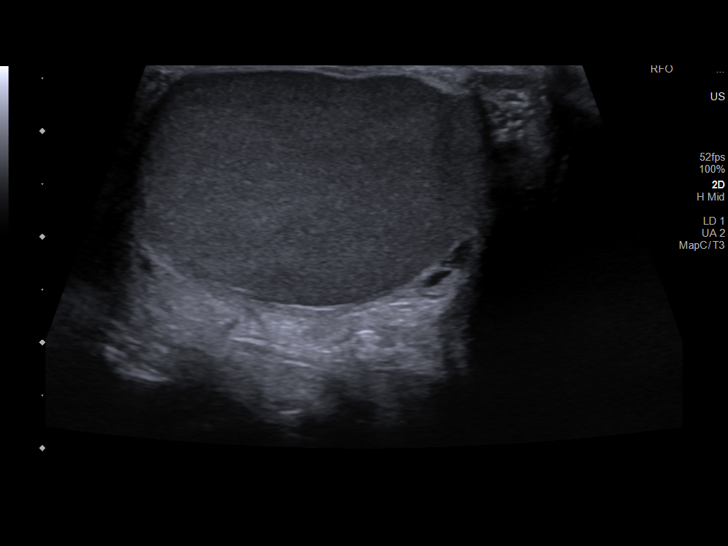
[im 39/58]
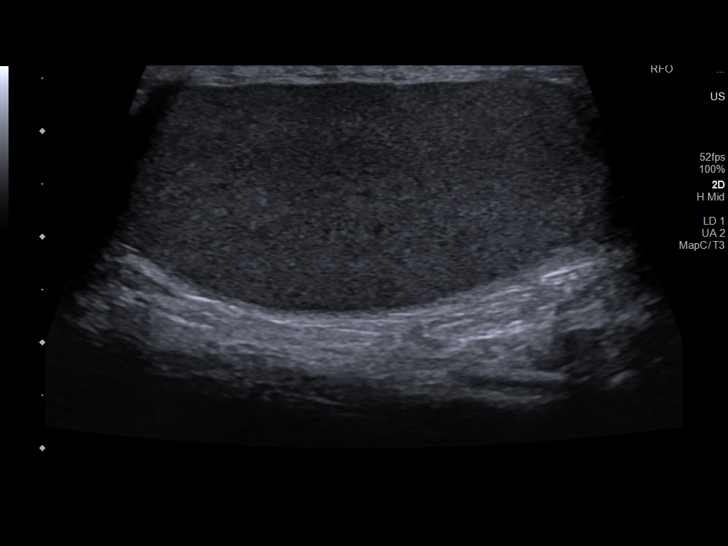
[im 43/58]
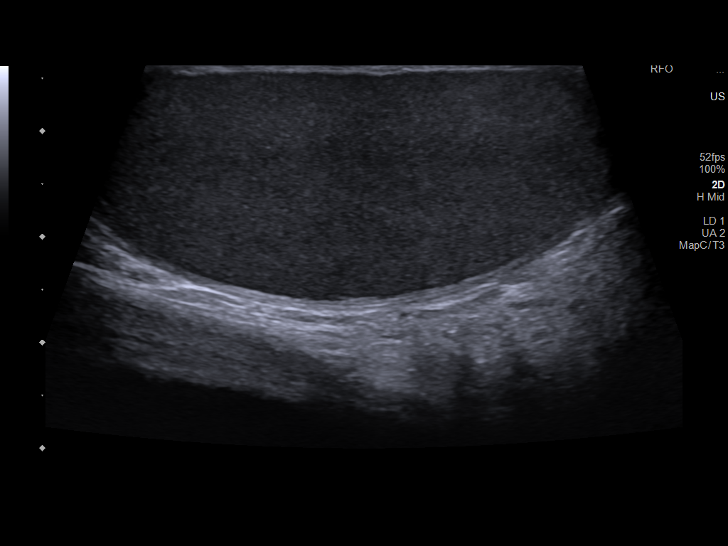
[im 48/58]
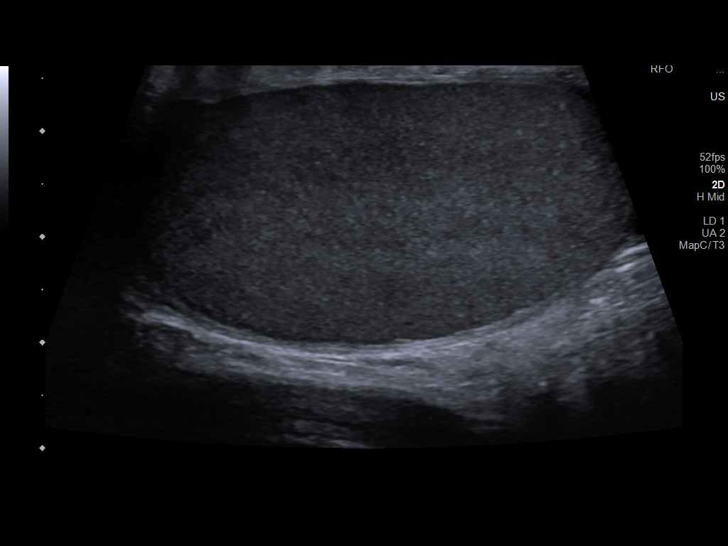
[im 53/58]
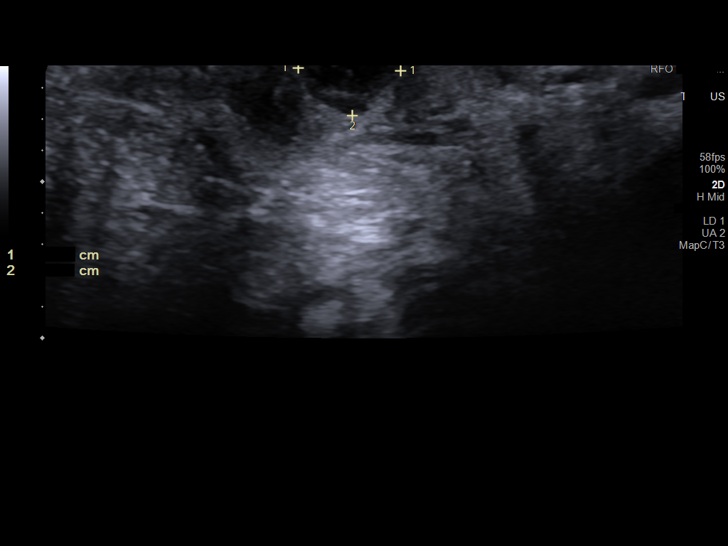
[im 58/58]
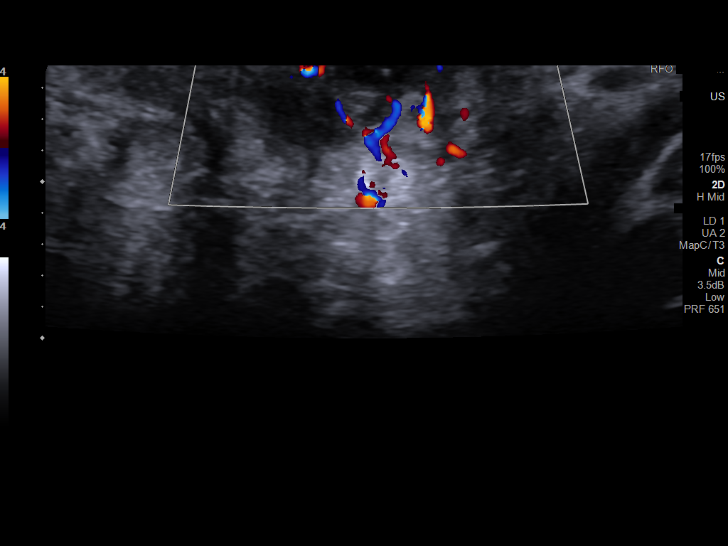

[13 of 25 positions shown; findings below may reference images not displayed]

FINDINGS: Right testicle

Measurements: 5.6 x 3.7 x 3.1 cm. No mass or microlithiasis
visualized.

Left testicle

Measurements: 4.9 x 2.1 x45.3 cm. No mass or microlithiasis
visualized.

Right epididymis:  Normal in size and appearance.

Left epididymis:  Normal in size and appearance.

Hydrocele:  Small bilateral hydroceles.

Varicocele:  None visualized.

Other: There is a 0.7 cm superficial subcutaneous lesion at the
posterior aspect of the scrotum.

Pulsed Doppler interrogation of both testes demonstrates normal low
resistance arterial and venous waveforms bilaterally.
IMPRESSION: 1. Normal ultrasound appearance of the bilateral testicles. Normal
arterial and venous Doppler flow bilaterally.
2. Small bilateral hydroceles.
3. There is a 0.7 cm superficial subcutaneous lesion at the
posterior aspect of the scrotum, likely a small inclusion cyst, and
almost certainly benign.

## 2022-06-14 IMAGING — CR DG HIP (WITH OR WITHOUT PELVIS) 2-3V*L*
3 series · 3 of 3 positions shown · non-contrast
Comparison: None.

CLINICAL DATA: Hip pain during physical therapy.  No injury.

EXAM:
DG HIP (WITH OR WITHOUT PELVIS) 2-3V LEFT

[t pelvis ap]
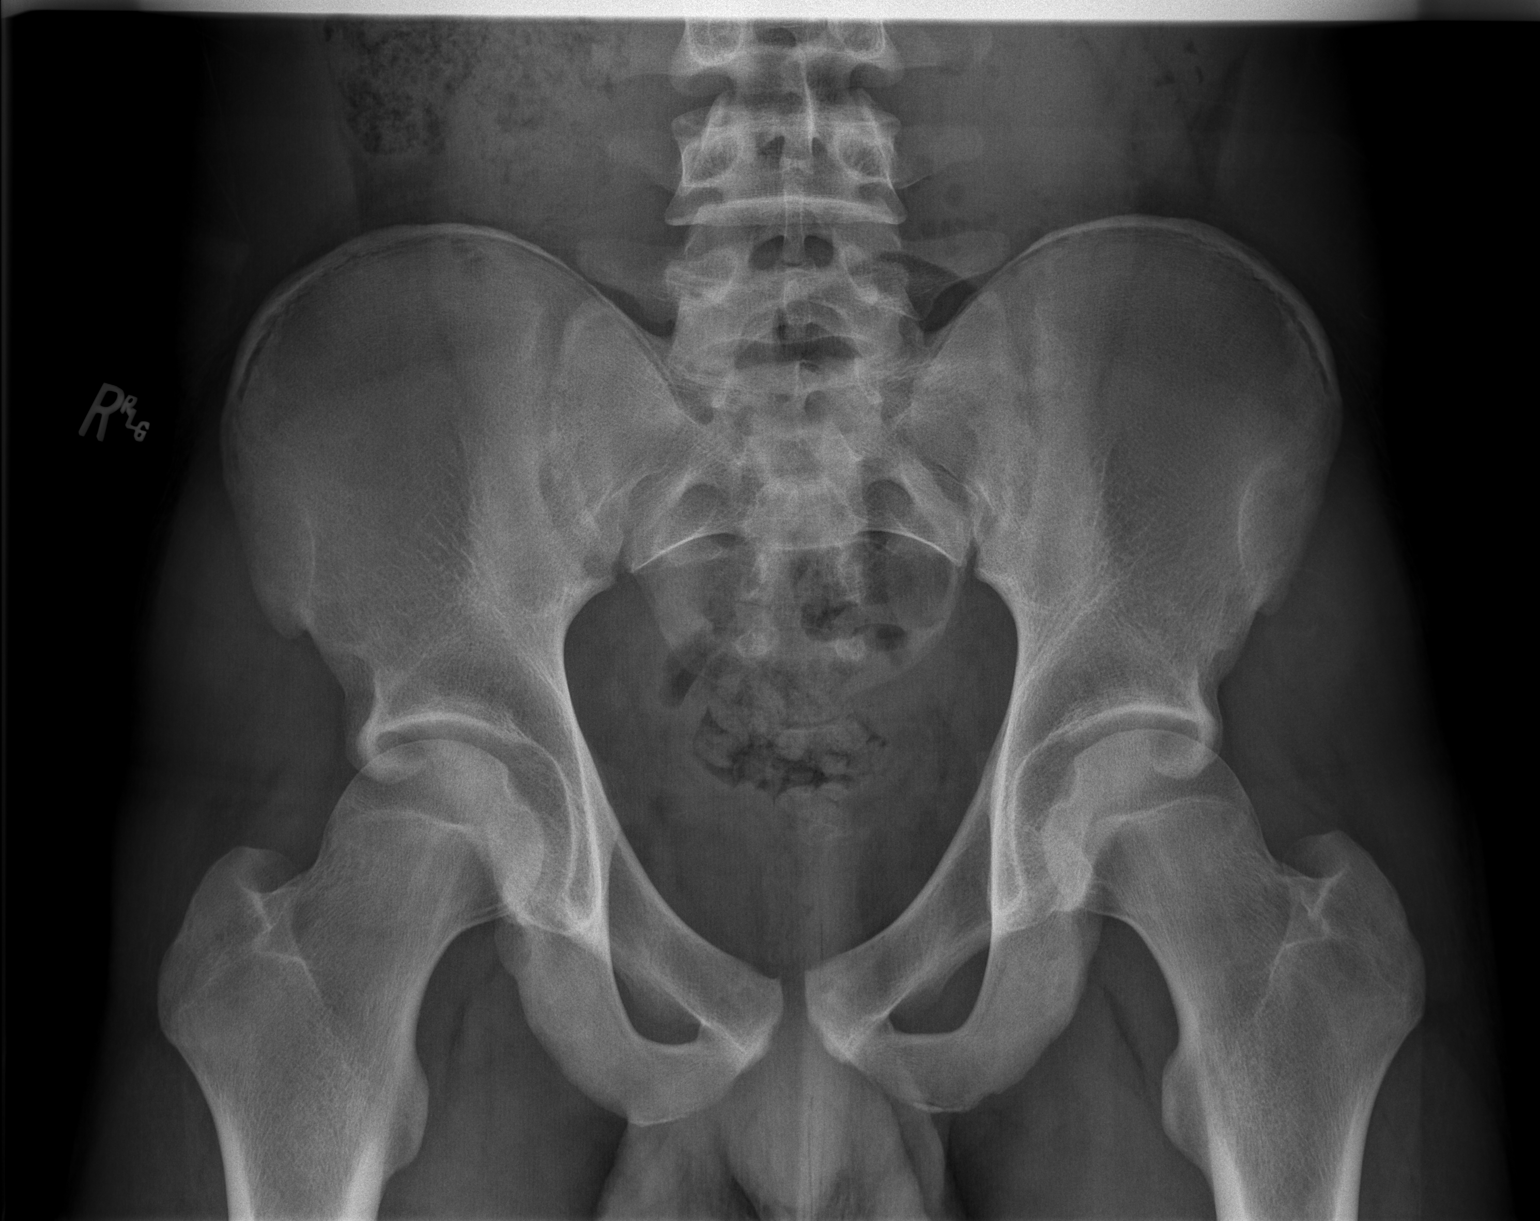

[t hip ap left]
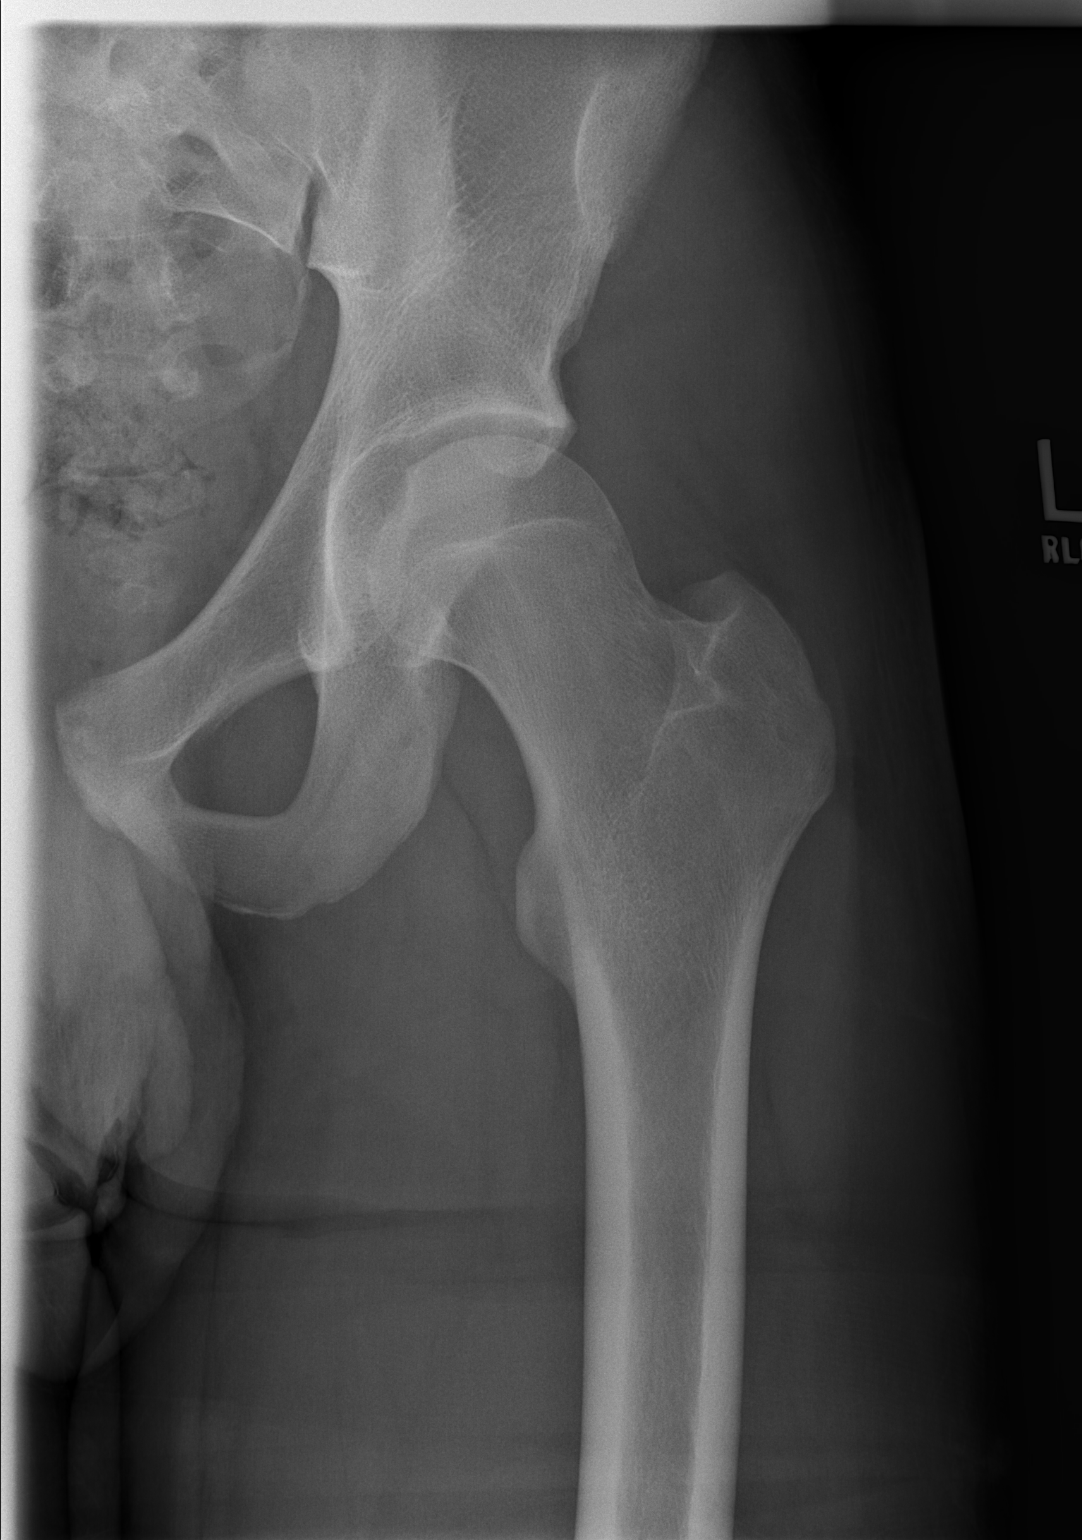

[t hip frog leg left]
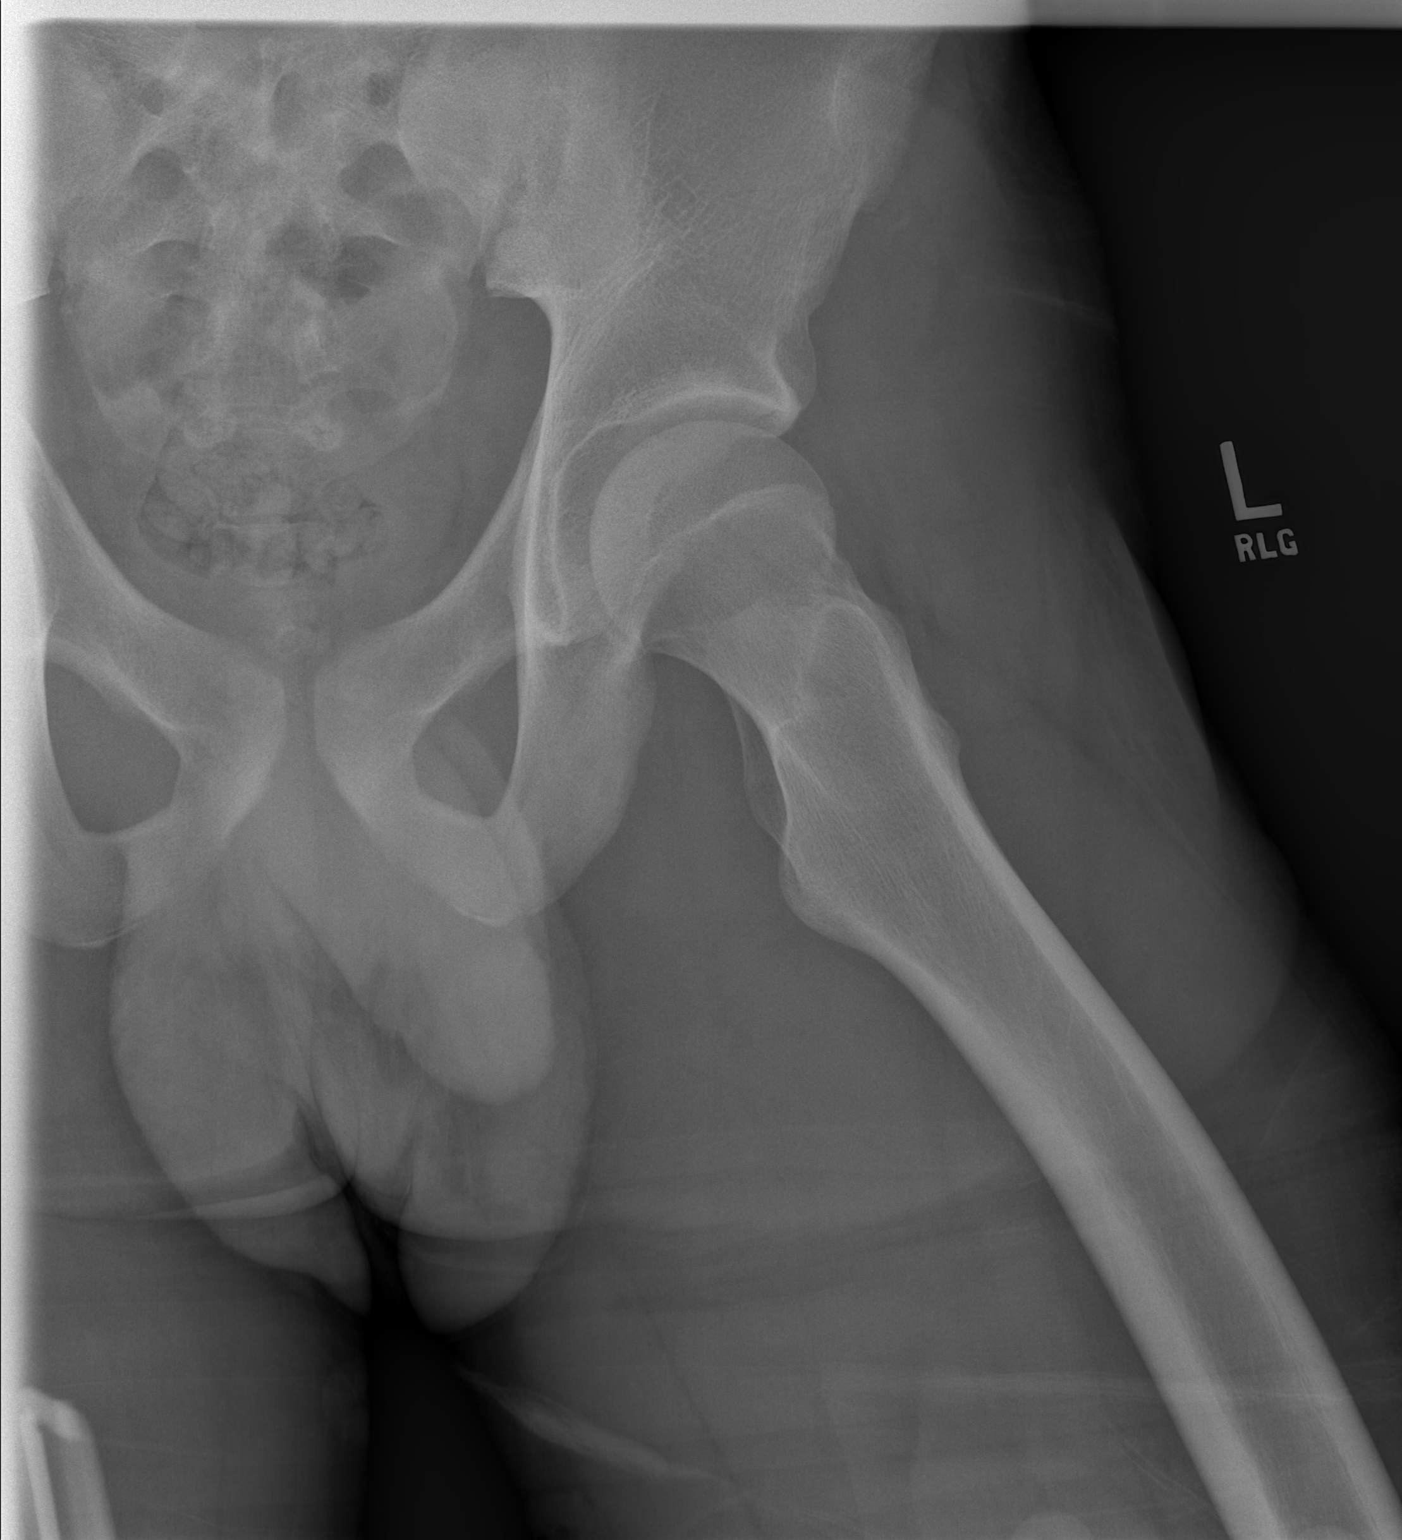

[3 of 3 positions shown; findings below may reference images not displayed]

FINDINGS: Hip joint space is normal. No degenerative changes. Sacroiliac
joints are patent.
IMPRESSION: Negative.

## 2022-06-18 ENCOUNTER — Other Ambulatory Visit: Payer: Self-pay

## 2022-06-18 ENCOUNTER — Emergency Department (HOSPITAL_COMMUNITY)
Admission: EM | Admit: 2022-06-18 | Discharge: 2022-06-18 | Disposition: A | Discharge status: Home / Self Care | Payer: Self-pay | Attending: Emergency Medicine | Admitting: Emergency Medicine

## 2022-06-18 ENCOUNTER — Emergency Department (HOSPITAL_COMMUNITY): Payer: Self-pay

## 2022-06-18 ENCOUNTER — Encounter (HOSPITAL_COMMUNITY): Payer: Self-pay

## 2022-06-18 DIAGNOSIS — N200 Calculus of kidney: Secondary | ICD-10-CM | POA: Insufficient documentation

## 2022-06-18 LAB — URINALYSIS, ROUTINE W REFLEX MICROSCOPIC
Bilirubin Urine: NEGATIVE
Glucose, UA: NEGATIVE mg/dL
Ketones, ur: NEGATIVE mg/dL
Nitrite: NEGATIVE
Protein, ur: NEGATIVE mg/dL
RBC / HPF: >50 RBC/hpf — ABNORMAL HIGH (ref 0–5)
Specific Gravity, Urine: 1.008 (ref 1.005–1.030)
pH: 7 (ref 5.0–8.0)

## 2022-06-18 LAB — CBC
HCT: 41 % (ref 39.0–52.0)
Hemoglobin: 13 g/dL (ref 13.0–17.0)
MCH: 27.7 pg (ref 26.0–34.0)
MCHC: 31.7 g/dL (ref 30.0–36.0)
MCV: 87.4 fL (ref 80.0–100.0)
Platelets: 259 10*3/uL (ref 150–400)
RBC: 4.69 MIL/uL (ref 4.22–5.81)
RDW: 13.4 % (ref 11.5–15.5)
WBC: 6.6 10*3/uL (ref 4.0–10.5)
nRBC: 0 % (ref 0.0–0.2)

## 2022-06-18 LAB — LIPASE, BLOOD: Lipase: 33 U/L (ref 11–51)

## 2022-06-18 LAB — COMPREHENSIVE METABOLIC PANEL
ALT: 21 U/L (ref 0–44)
AST: 19 U/L (ref 15–41)
Albumin: 3.5 g/dL (ref 3.5–5.0)
Alkaline Phosphatase: 69 U/L (ref 38–126)
Anion gap: 5 (ref 5–15)
BUN: 9 mg/dL (ref 6–20)
CO2: 26 mmol/L (ref 22–32)
Calcium: 9.8 mg/dL (ref 8.9–10.3)
Chloride: 108 mmol/L (ref 98–111)
Creatinine, Ser: 1.08 mg/dL (ref 0.61–1.24)
GFR, Estimated: >60 mL/min (ref >60)
Glucose, Bld: 89 mg/dL (ref 70–99)
Potassium: 4.2 mmol/L (ref 3.5–5.1)
Sodium: 139 mmol/L (ref 135–145)
Total Bilirubin: 0.5 mg/dL (ref 0.3–1.2)
Total Protein: 5.6 g/dL — ABNORMAL LOW (ref 6.5–8.1)

## 2022-06-18 MED ORDER — ONDANSETRON HCL 4 MG PO TABS: 4.0000 mg | ORAL_TABLET | Freq: Three times a day (TID) | ORAL | 0 refills | Status: AC | PRN

## 2022-06-18 MED ORDER — HYDROCODONE-ACETAMINOPHEN 5-325 MG PO TABS: 1.0000 | ORAL_TABLET | Freq: Four times a day (QID) | ORAL | 0 refills | Status: AC | PRN

## 2022-06-18 MED ORDER — TAMSULOSIN HCL 0.4 MG PO CAPS: 0.4000 mg | ORAL_CAPSULE | Freq: Every day | ORAL | 0 refills | Status: AC

## 2022-06-18 NOTE — ED Triage Notes (Signed)
Pt reports left flank pain and nausea that started at 1pm this morning. Pt reports the pain increasing since then. Off/on pain.

## 2022-06-18 NOTE — Discharge Instructions (Signed)
You have been diagnosed with having a 5 mm kidney stone on the left side.  You would likely pass the stone in the next several days.  Take medication prescribed as needed for symptom control.  Use a urine strainer to capture the stone and bring it to the urologist for further assessment.  Return if you have any concern.

## 2022-06-18 NOTE — ED Provider Notes (Signed)
[facility] EMERGENCY DEPARTMENT Provider Note   CSN: [id number] Arrival date & time: 06/18/22  1018     History  Chief Complaint  Patient presents with   Back Pain    Dennis Phillips is a 21 y.o. male.  The history is provided by the patient and medical records. No language interpreter was used.  Back Pain    21 year old male who presents with complaints of flank pain.  Patient reported acute onset of left flank pain that started early this morning.  Pain is intense, with some nausea and he vomited once.  Pain lasted for approximately 2 hours and has since resolved.  No fever or chills no productive cough no dysuria hematuria.  Denies any recent strenuous activities or heavy lifting however he does exercise daily.  He recall having 1 similar event like this a year ago which resolved without any treatment.  No prior history of kidney stone.  No recent trauma.  Home Medications Prior to Admission medications   Medication Sig Start Date End Date Taking? Authorizing Provider  HYDROcodone-acetaminophen (NORCO/VICODIN) 5-325 MG per tablet Take 1 tablet by mouth every 4 (four) hours as needed for pain. Patient not taking: No sig reported 03/05/13   Burgess Amor, PA-C  ibuprofen (ADVIL) 600 MG tablet Take 1 tablet (600 mg total) by mouth every 6 (six) hours as needed for up to 30 doses for mild pain or moderate pain. Patient not taking: Reported on 04/15/2021 12/05/20   Terald Sleeper, MD  ibuprofen (ADVIL,MOTRIN) 200 MG tablet Take 200 mg by mouth every 6 (six) hours as needed for pain.    [provider]      Allergies    Patient has no known allergies.    Review of Systems   Review of Systems  Musculoskeletal:  Positive for back pain.  All other systems reviewed and are negative.   Physical Exam Updated Vital Signs BP 118/79 (BP Location: Right Arm)   Pulse (!) 52   Temp 98.2 F (36.8 C) (Oral)   Resp 16   Ht 6\' 1"  (1.854 m)   Wt 79.8 kg   SpO2 100%   BMI 23.22  kg/m  Physical Exam Vitals and nursing note reviewed.  Constitutional:      General: He is not in acute distress.    Appearance: He is well-developed.  HENT:     Head: Atraumatic.  Eyes:     Conjunctiva/sclera: Conjunctivae normal.  Cardiovascular:     Rate and Rhythm: Normal rate and regular rhythm.  Pulmonary:     Effort: Pulmonary effort is normal.     Breath sounds: Normal breath sounds.  Abdominal:     Palpations: Abdomen is soft.     Tenderness: There is no abdominal tenderness. There is no right CVA tenderness or left CVA tenderness.  Musculoskeletal:     Cervical back: Neck supple.  Skin:    Findings: No rash.  Neurological:     Mental Status: He is alert.     ED Results / Procedures / Treatments   Labs (all labs ordered are listed, but only abnormal results are displayed) Labs Reviewed  COMPREHENSIVE METABOLIC PANEL - Abnormal; Notable for the following components:      Result Value   Total Protein 5.6 (*)    All other components within normal limits  URINALYSIS, ROUTINE W REFLEX MICROSCOPIC - Abnormal; Notable for the following components:   Hgb urine dipstick LARGE (*)    Leukocytes,Ua TRACE (*)  RBC / HPF >50 (*)    Bacteria, UA RARE (*)    All other components within normal limits  LIPASE, BLOOD  CBC    EKG None  Radiology CT Renal Stone Study  Result Date: 06/18/2022 CLINICAL DATA:  Left flank pain EXAM: CT ABDOMEN AND PELVIS WITHOUT CONTRAST TECHNIQUE: Multidetector CT imaging of the abdomen and pelvis was performed following the standard protocol without IV contrast. RADIATION DOSE REDUCTION: This exam was performed according to the departmental dose-optimization program which includes automated exposure control, adjustment of the mA and/or kV according to patient size and/or use of iterative reconstruction technique. COMPARISON:  None Available. FINDINGS: Lower chest: Included lung bases are clear.  Heart size is normal. Hepatobiliary:  Unremarkable unenhanced appearance of the liver. No focal liver lesion identified. Gallbladder within normal limits. No hyperdense gallstone. No biliary dilatation. Pancreas: Unremarkable. No pancreatic ductal dilatation or surrounding inflammatory changes. Spleen: Normal in size without focal abnormality. Adrenals/Urinary Tract: Unremarkable adrenal glands. 5 x 4 mm stone within the mid left ureter resulting in mild left hydronephrosis. Additional tiny punctate stone within the lower pole of the left kidney. There are several punctate nonobstructing stones within the right kidney. No right ureteral calculi. No right hydronephrosis. Urinary bladder is incompletely distended. Stomach/Bowel: Stomach is within normal limits. Appendix appears normal. No evidence of bowel wall thickening, distention, or inflammatory changes. Vascular/Lymphatic: No significant vascular findings are present. No enlarged abdominal or pelvic lymph nodes. Reproductive: Prostate is unremarkable.  Trace bilateral hydroceles. Other: No free fluid. No abdominopelvic fluid collection. No pneumoperitoneum. No abdominal wall hernia. Musculoskeletal: No acute or significant osseous findings. IMPRESSION: 1. Obstructing 5 x 4 mm stone within the mid left ureter resulting in mild left hydronephrosis. 2. Additional punctate nonobstructing bilateral renal calculi. 3. Trace bilateral hydroceles. Electronically Signed   By: Davina Poke D.O.   On: 06/18/2022 12:16    Procedures Procedures    Medications Ordered in ED Medications - No data to display  ED Course/ Medical Decision Making/ A&P                           Medical Decision Making Amount and/or Complexity of Data Reviewed Labs: ordered. Radiology: ordered.   BP 118/79 (BP Location: Right Arm)   Pulse (!) 52   Temp 98.2 F (36.8 C) (Oral)   Resp 16   Ht 6\' 1"  (1.854 m)   Wt 79.8 kg   SpO2 100%   BMI 23.22 kg/m   86:42 AM 21 year old male who presents with complaints  of flank pain.  Patient reported acute onset of left flank pain that started early this morning.  Pain is intense, with some nausea and he vomited once.  Pain lasted for approximately 2 hours and has since resolved.  No fever or chills no productive cough no dysuria hematuria.  Denies any recent strenuous activities or heavy lifting however he does exercise daily.  He recall having 1 similar event like this a year ago which resolved without any treatment.  No prior history of kidney stone.  No recent trauma.  On exam this is a well-appearing male laying in bed appears to be in no acute discomfort.  Heart lung sounds normal.  Abdomen is soft nontender.  He does not have any CVA tenderness.  He does not have any overlying skin changes to his back.  He does not have any midline spine tenderness.  Urinalysis obtained independently viewed by me which  shows large hemoglobin suggestive of potential kidney stone.  CT renal study ordered.  1:36 PM CT renal stone study demonstrated an obstructing 5 x 4 mm stone within the left mid ureter resulting in mild left hydronephrosis.  This finding is consistent with patient's presentation.  Will discharge home with urine strainer, opiate pain medication, Flomax, and referral to urology for outpatient care.  Return precaution given.  This patient presents to the ED for concern of flank pain, this involves an extensive number of treatment options, and is a complaint that carries with it a high risk of complications and morbidity.  The differential diagnosis includes kidney stone, pyelonephritis, MSK, dissection, shingle  Co morbidities that complicate the patient evaluation none Additional history obtained:  Additional history obtained from father External records from outside source obtained and reviewed including EMR  Lab Tests:  I Ordered, and personally interpreted labs.  The pertinent results include:  as above  Imaging Studies ordered:  I ordered imaging  studies including ct renal stone study I independently visualized and interpreted imaging which showed kidney stone I agree with the radiologist interpretation  Cardiac Monitoring:  The patient was maintained on a cardiac monitor.  I personally viewed and interpreted the cardiac monitored which showed an underlying rhythm of: sinus bradycardia  Medicines ordered and prescription drug management:  I ordered medication including none  for pain Reevaluation of the patient after these medicines showed that the patient improved I have reviewed the patients home medicines and have made adjustments as needed  Test Considered: renal US  Critical Interventions: none   Problem List / ED Course: left flank pain  Reevaluation:  After the interventions noted above, I reevaluated the patient and found that they have :improved  Social Determinants of Health: none  Dispostion:  After consideration of the diagnostic results and the patients response to treatment, I feel that the patent would benefit from outpt f/u.         Final Clinical Impression(s) / ED Diagnoses Final diagnoses:  Kidney stone on left side    Rx / DC Orders ED Discharge Orders          Ordered    HYDROcodone-acetaminophen (NORCO/VICODIN) 5-325 MG tablet  Every 6 hours PRN        06/18/22 1339    tamsulosin (FLOMAX) 0.4 MG CAPS capsule  Daily        06/18/22 1339    ondansetron (ZOFRAN) 4 MG tablet  Every 8 hours PRN        06/18/22 1339              Fayrene Helper, PA-C 06/18/22 1340    Bethann Berkshire, MD 06/20/22 [phone]

## 2022-06-18 NOTE — ED Notes (Signed)
Patient transported to CT 

## 2023-01-24 ENCOUNTER — Emergency Department (HOSPITAL_COMMUNITY)
Admission: EM | Admit: 2023-01-24 | Discharge: 2023-01-24 | Disposition: A | Discharge status: Home / Self Care | Payer: Self-pay | Attending: Emergency Medicine | Admitting: Emergency Medicine

## 2023-01-24 ENCOUNTER — Emergency Department (HOSPITAL_COMMUNITY): Payer: Self-pay

## 2023-01-24 ENCOUNTER — Encounter (HOSPITAL_COMMUNITY): Payer: Self-pay | Admitting: Emergency Medicine

## 2023-01-24 ENCOUNTER — Other Ambulatory Visit: Payer: Self-pay

## 2023-01-24 DIAGNOSIS — N201 Calculus of ureter: Secondary | ICD-10-CM

## 2023-01-24 DIAGNOSIS — N132 Hydronephrosis with renal and ureteral calculous obstruction: Secondary | ICD-10-CM | POA: Insufficient documentation

## 2023-01-24 LAB — CBC WITH DIFFERENTIAL/PLATELET
Abs Immature Granulocytes: 0.01 10*3/uL (ref 0.00–0.07)
Basophils Absolute: 0 10*3/uL (ref 0.0–0.1)
Basophils Relative: 1 %
Eosinophils Absolute: 0 10*3/uL (ref 0.0–0.5)
Eosinophils Relative: 1 %
HCT: 38.8 % — ABNORMAL LOW (ref 39.0–52.0)
Hemoglobin: 12.2 g/dL — ABNORMAL LOW (ref 13.0–17.0)
Immature Granulocytes: 0 %
Lymphocytes Relative: 32 %
Lymphs Abs: 1.7 10*3/uL (ref 0.7–4.0)
MCH: 27.7 pg (ref 26.0–34.0)
MCHC: 31.4 g/dL (ref 30.0–36.0)
MCV: 88.2 fL (ref 80.0–100.0)
Monocytes Absolute: 0.4 10*3/uL (ref 0.1–1.0)
Monocytes Relative: 7 %
Neutro Abs: 3.1 10*3/uL (ref 1.7–7.7)
Neutrophils Relative %: 59 %
Platelets: 244 10*3/uL (ref 150–400)
RBC: 4.4 MIL/uL (ref 4.22–5.81)
RDW: 13.2 % (ref 11.5–15.5)
WBC: 5.3 10*3/uL (ref 4.0–10.5)
nRBC: 0 % (ref 0.0–0.2)

## 2023-01-24 LAB — BASIC METABOLIC PANEL
Anion gap: 10 (ref 5–15)
BUN: 14 mg/dL (ref 6–20)
CO2: 21 mmol/L — ABNORMAL LOW (ref 22–32)
Calcium: 11.2 mg/dL — ABNORMAL HIGH (ref 8.9–10.3)
Chloride: 104 mmol/L (ref 98–111)
Creatinine, Ser: 1.22 mg/dL (ref 0.61–1.24)
GFR, Estimated: >60 mL/min (ref >60)
Glucose, Bld: 88 mg/dL (ref 70–99)
Potassium: 4.1 mmol/L (ref 3.5–5.1)
Sodium: 135 mmol/L (ref 135–145)

## 2023-01-24 LAB — URINALYSIS, ROUTINE W REFLEX MICROSCOPIC
Bacteria, UA: NONE SEEN
Bilirubin Urine: NEGATIVE
Glucose, UA: NEGATIVE mg/dL
Ketones, ur: 5 mg/dL — AB
Leukocytes,Ua: NEGATIVE
Nitrite: NEGATIVE
Protein, ur: 30 mg/dL — AB
RBC / HPF: >50 RBC/hpf (ref 0–5)
Specific Gravity, Urine: 1.018 (ref 1.005–1.030)
pH: 6 (ref 5.0–8.0)

## 2023-01-24 NOTE — Discharge Instructions (Signed)
It was a pleasure taking care of you today.  You are seen today for trouble emptying your bladder.  You have a kidney stone on the right side near your bladder is causing irritation in your symptoms.  Drink plenty of fluids, come back if you have fever, abdominal or back pain, burning with urination or other symptoms.  Otherwise follow-up with urology.  Call today to schedule appointment

## 2023-01-24 NOTE — ED Triage Notes (Signed)
Pt states went to bathroom to urinate due to feeling the urge but only a little came out this am and still feels like he needs to urinate. Denies any genital swelling. Nad. Mm moist.

## 2023-01-24 NOTE — ED Provider Notes (Signed)
[place] EMERGENCY DEPARTMENT AT [facility] Provider Note   CSN: [id number] Arrival date & time: 01/24/23  4098     History  Chief Complaint  Patient presents with   Urinary Retention    Dennis Phillips is a 22 y.o. male.  History of kidney stones in the past.  Presents the ER complaining of difficulty emptying his bladder today, started this morning he was at the gym.  Denies any dysuria, states he feels pressure in his bladder area when he tries to void and states that is hard to empty the bladder.  Denies fevers or chills.  He states he occasionally has some aching sensation in the left testicle without any significant ear pain swelling or tenderness has been going on for a couple of weeks.  He also reports he has some left flank pain yesterday that is now resolved.  Did have visit to the ED in October 2023 for kidney stone.  He states the pain went away, he never saw the stone pass or follow-up with urology.  He reports he is symptoms as the symptoms resolved he assumed it had passed.  Patient reports he is sexually active, with 1 male only, reports consistent condom use. HPI     Home Medications Prior to Admission medications   Medication Sig Start Date End Date Taking? Authorizing Provider  HYDROcodone-acetaminophen (NORCO/VICODIN) 5-325 MG tablet Take 1 tablet by mouth every 6 (six) hours as needed for severe pain or moderate pain. 06/18/22   Fayrene Helper, PA-C  ibuprofen (ADVIL,MOTRIN) 200 MG tablet Take 200 mg by mouth every 6 (six) hours as needed for pain.    [provider]  ondansetron (ZOFRAN) 4 MG tablet Take 1 tablet (4 mg total) by mouth every 8 (eight) hours as needed for nausea or vomiting. 06/18/22   Fayrene Helper, PA-C  tamsulosin (FLOMAX) 0.4 MG CAPS capsule Take 1 capsule (0.4 mg total) by mouth daily. 06/18/22   Fayrene Helper, PA-C      Allergies    Patient has no known allergies.    Review of Systems   Review of Systems  Physical  Exam Updated Vital Signs BP 133/72 (BP Location: Right Arm)   Pulse (!) 48 Comment: pt states hr always low and in 40s. pt does work out a lot  Temp 98.4 F (36.9 C) (Oral)   Resp 16   SpO2 100%  Physical Exam Vitals and nursing note reviewed.  Constitutional:      General: He is not in acute distress.    Appearance: He is well-developed.  HENT:     Head: Normocephalic and atraumatic.     Mouth/Throat:     Mouth: Mucous membranes are moist.  Eyes:     Conjunctiva/sclera: Conjunctivae normal.  Cardiovascular:     Rate and Rhythm: Normal rate and regular rhythm.     Heart sounds: No murmur heard. Pulmonary:     Effort: Pulmonary effort is normal. No respiratory distress.     Breath sounds: Normal breath sounds.  Abdominal:     Palpations: Abdomen is soft.     Tenderness: There is no abdominal tenderness. There is no right CVA tenderness or left CVA tenderness.  Musculoskeletal:        General: No swelling.     Cervical back: Neck supple.  Skin:    General: Skin is warm and dry.     Capillary Refill: Capillary refill takes less than 2 seconds.  Neurological:     General:  No focal deficit present.     Mental Status: He is alert and oriented to person, place, and time.  Psychiatric:        Mood and Affect: Mood normal.     ED Results / Procedures / Treatments   Labs (all labs ordered are listed, but only abnormal results are displayed) Labs Reviewed  URINALYSIS, ROUTINE W REFLEX MICROSCOPIC - Abnormal; Notable for the following components:      Result Value   Hgb urine dipstick LARGE (*)    Ketones, ur 5 (*)    Protein, ur 30 (*)    All other components within normal limits  CBC WITH DIFFERENTIAL/PLATELET - Abnormal; Notable for the following components:   Hemoglobin 12.2 (*)    HCT 38.8 (*)    All other components within normal limits  BASIC METABOLIC PANEL - Abnormal; Notable for the following components:   CO2 21 (*)    Calcium 11.2 (*)    All other components  within normal limits  URINE CULTURE  GC/CHLAMYDIA PROBE AMP ([place]) NOT AT [facility]    EKG None  Radiology CT Renal Stone Study  Result Date: 01/24/2023 CLINICAL DATA:  Abdominal and flank pain.  Unable to urinate. EXAM: CT ABDOMEN AND PELVIS WITHOUT CONTRAST TECHNIQUE: Multidetector CT imaging of the abdomen and pelvis was performed following the standard protocol without IV contrast. RADIATION DOSE REDUCTION: This exam was performed according to the departmental dose-optimization program which includes automated exposure control, adjustment of the mA and/or kV according to patient size and/or use of iterative reconstruction technique. COMPARISON:  06/18/2022 FINDINGS: Lower chest: Normal Hepatobiliary: Normal Pancreas: Normal Spleen: Normal Adrenals/Urinary Tract: Adrenal glands are normal. The right kidney shows a nonobstructing 3 mm stone in the lower pole and a second 3 mm stone in the midportion. Previously seen 4 mm stone in the lower pole is no longer present in that location, but is present within the distal right ureter just proximal to the UVJ. Mild right hydroureteronephrosis. On the left, there are 2 punctate nonobstructing stones in the lower pole. No hydronephrosis or passing stone presently. No stone in the bladder. No stone in the urethra. Stomach/Bowel: Normal Vascular/Lymphatic: Normal Reproductive: Normal Other: No free fluid or air. Musculoskeletal: Normal IMPRESSION: 1. Previously seen 4 mm stone in the lower pole of the right kidney is no longer present in that location, but is present within the distal right ureter just proximal to the UVJ. Mild right hydroureteronephrosis. 2. Two 3 mm nonobstructing stones in the right kidney. Two punctate nonobstructing stones in the lower pole of the left kidney. Electronically Signed   By: Paulina Fusi M.D.   On: 01/24/2023 10:32    Procedures Procedures    Medications Ordered in ED Medications - No data to display  ED Course/  Medical Decision Making/ A&P                             Medical Decision Making DDX: UTI, STI, ureterolithiasis, other  ED course: Patient presents for urinary frequency and unable to fully empty his bladder.  No fevers or chills, no flank pain today but he did have some vague low back pain yesterday.  No nausea or vomiting.  History of kidney stones.  Urine shows red blood cells white blood cells but no nitrates, no bacteria.  CT did show a 4 mm right distal UVJ stone with mild hydronephrosis.  I consulted urology Dr. Arvilla Market  and discussed with him. The WBC in urine is near likely due to irritation and inflammation, no signs of infection specially given lack of dysuria, patient can follow-up as an outpatient, no need for antibiotics.  CBC and CMP also interpreted by me, BMP has mildly decreased CO2, CBC shows mild anemia hemoglobin of 12.2.  Amount and/or Complexity of Data Reviewed External Data Reviewed: labs, radiology and notes. Labs: ordered. Decision-making details documented in ED Course. Radiology: ordered and independent interpretation performed.    Details: Right distal UVJ stone approximately 4 mm.  I agree with radiology report.           Final Clinical Impression(s) / ED Diagnoses Final diagnoses:  Ureterolithiasis    Rx / DC Orders ED Discharge Orders     None         Josem Kaufmann 01/24/23 1127    Gloris Manchester, MD 01/24/23 [phone]

## 2023-01-25 LAB — GC/CHLAMYDIA PROBE AMP (~~LOC~~) NOT AT ARMC
Chlamydia: NEGATIVE
Comment: NEGATIVE
Comment: NORMAL
Neisseria Gonorrhea: NEGATIVE

## 2023-01-25 LAB — URINE CULTURE: Culture: NO GROWTH

## 2023-02-20 ENCOUNTER — Emergency Department (HOSPITAL_COMMUNITY)
Admission: EM | Admit: 2023-02-20 | Discharge: 2023-02-20 | Disposition: A | Discharge status: Home / Self Care | Payer: Self-pay | Attending: Emergency Medicine | Admitting: Emergency Medicine

## 2023-02-20 ENCOUNTER — Other Ambulatory Visit: Payer: Self-pay

## 2023-02-20 ENCOUNTER — Encounter (HOSPITAL_COMMUNITY): Payer: Self-pay | Admitting: Emergency Medicine

## 2023-02-20 DIAGNOSIS — J029 Acute pharyngitis, unspecified: Secondary | ICD-10-CM | POA: Insufficient documentation

## 2023-02-20 DIAGNOSIS — H6121 Impacted cerumen, right ear: Secondary | ICD-10-CM | POA: Insufficient documentation

## 2023-02-20 LAB — GROUP A STREP BY PCR: Group A Strep by PCR: NOT DETECTED

## 2023-02-20 MED ORDER — IBUPROFEN 800 MG PO TABS
800.0000 mg | ORAL_TABLET | Freq: Once | ORAL | Status: AC
  Administered 2023-02-20: 800 mg via ORAL
  Filled 2023-02-20: qty 1

## 2023-02-20 MED ORDER — LIDOCAINE VISCOUS HCL 2 % MT SOLN
15.0000 mL | Freq: Once | OROMUCOSAL | Status: AC
  Administered 2023-02-20: 15 mL via OROMUCOSAL
  Filled 2023-02-20: qty 15

## 2023-02-20 NOTE — ED Provider Notes (Signed)
[place] EMERGENCY DEPARTMENT AT [facility] Provider Note   CSN: [id number] Arrival date & time: 02/20/23  1141     History  Chief Complaint  Patient presents with   Sore Throat   HPI Dennis Phillips is a 22 y.o. male presenting for sore throat.  Started 2 weeks ago.  Also endorsing intermittent right ear pain.  Denies fever.  Denies drooling or trouble swallowing.  States that she has been persistent prompting his evaluation today.  Took ibuprofen for symptoms yesterday and it helped.   Sore Throat       Home Medications Prior to Admission medications   Medication Sig Start Date End Date Taking? Authorizing Provider  HYDROcodone-acetaminophen (NORCO/VICODIN) 5-325 MG tablet Take 1 tablet by mouth every 6 (six) hours as needed for severe pain or moderate pain. 06/18/22   Fayrene Helper, PA-C  ibuprofen (ADVIL,MOTRIN) 200 MG tablet Take 200 mg by mouth every 6 (six) hours as needed for pain.    [provider]  ondansetron (ZOFRAN) 4 MG tablet Take 1 tablet (4 mg total) by mouth every 8 (eight) hours as needed for nausea or vomiting. 06/18/22   Fayrene Helper, PA-C  tamsulosin (FLOMAX) 0.4 MG CAPS capsule Take 1 capsule (0.4 mg total) by mouth daily. 06/18/22   Fayrene Helper, PA-C      Allergies    Patient has no known allergies.    Review of Systems   See HPI for pertinent positives  Physical Exam Updated Vital Signs BP 126/69 (BP Location: Right Arm)   Pulse (!) 53   Temp 99 F (37.2 C) (Oral)   Resp 14   Ht 6\' 1"  (1.854 m)   Wt 81.6 kg   SpO2 100%   BMI 23.75 kg/m  Physical Exam Constitutional:      Appearance: Normal appearance.  HENT:     Head: Normocephalic.     Right Ear: There is impacted cerumen.     Ears:     Comments: Difficult to visualize the right TM given cerumen impaction    Nose: Nose normal.     Mouth/Throat:     Lips: Pink.     Mouth: Mucous membranes are moist.     Tongue: No lesions. Tongue does not deviate from midline.      Palate: No mass and lesions.     Pharynx: Oropharynx is clear. Uvula midline. No pharyngeal swelling, oropharyngeal exudate, posterior oropharyngeal erythema or uvula swelling.     Tonsils: No tonsillar exudate or tonsillar abscesses.     Comments: Right tonsil mildly edematous Eyes:     Conjunctiva/sclera: Conjunctivae normal.  Pulmonary:     Effort: Pulmonary effort is normal.  Neurological:     Mental Status: He is alert.  Psychiatric:        Mood and Affect: Mood normal.     ED Results / Procedures / Treatments   Labs (all labs ordered are listed, but only abnormal results are displayed) Labs Reviewed  GROUP A STREP BY PCR    EKG None  Radiology No results found.  Procedures Procedures    Medications Ordered in ED Medications  lidocaine (XYLOCAINE) 2 % viscous mouth solution 15 mL (has no administration in time range)  ibuprofen (ADVIL) tablet 800 mg (has no administration in time range)    ED Course/ Medical Decision Making/ A&P  Medical Decision Making  22 year old well-appearing male presenting for sore throat.  Exam notable for subtle right tonsillar edema otherwise unremarkable.  DDx includes peritonsillar abscess, deep space infection/abscess, strep throat, and otitis media.  Overall appears clinically well.  Strep PCR was negative.  No suggestions of abscess or deep space infection/abscess of the neck.  Could not visualize the right TM due to cerumen impaction.  Symptoms are consistent with viral process.  Advised to treat conservatively at home and follow-up with PCP.  Discussed pertinent return precautions.  Vital stable discharge.  Discharged home in good condition.        Final Clinical Impression(s) / ED Diagnoses Final diagnoses:  Sore throat    Rx / DC Orders ED Discharge Orders     None         Gareth Eagle, PA-C 02/20/23 1347    Gloris Manchester, MD 02/21/23 [phone]

## 2023-02-20 NOTE — ED Triage Notes (Signed)
Pt c/o sore throat and tonsillar swelling x 2 weeks. He has been taking OTC meds without improvement. Pt also has right ear pain and scratchiness in the back of his throat. No difficulty breathing, no cough, no fevers reported. Pt mentions that he has what feels like a small nontender external lump on the left lower throat.

## 2023-02-20 NOTE — Discharge Instructions (Signed)
Evaluation for your sore throat was overall reassuring.  I did see some mild right tonsillar swelling.  Recommend you follow-up with your PCP.  If you have new drooling, inability to tolerate fluid intake, fever, hearing loss or ringing in your ear or any other concerning symptom please return emerged part for further evaluation.

## 2023-06-01 ENCOUNTER — Emergency Department (HOSPITAL_COMMUNITY)
Admission: EM | Admit: 2023-06-01 | Discharge: 2023-06-01 | Discharge status: Against Medical Advice | Payer: Self-pay | Attending: Emergency Medicine | Admitting: Emergency Medicine

## 2023-06-01 ENCOUNTER — Encounter (HOSPITAL_COMMUNITY): Payer: Self-pay

## 2023-06-01 DIAGNOSIS — Z5321 Procedure and treatment not carried out due to patient leaving prior to being seen by health care provider: Secondary | ICD-10-CM | POA: Insufficient documentation

## 2023-06-01 DIAGNOSIS — J029 Acute pharyngitis, unspecified: Secondary | ICD-10-CM | POA: Insufficient documentation

## 2023-06-01 NOTE — ED Triage Notes (Signed)
C/o sore throat and right ear pain with no improvement since seen last on 01/2023.  Denies fever, trouble swallowing, sob, drooling.

## 2023-08-22 ENCOUNTER — Other Ambulatory Visit: Payer: Self-pay

## 2023-08-22 ENCOUNTER — Encounter (HOSPITAL_COMMUNITY): Payer: Self-pay

## 2023-08-22 ENCOUNTER — Emergency Department (HOSPITAL_COMMUNITY)
Admission: EM | Admit: 2023-08-22 | Discharge: 2023-08-22 | Disposition: A | Discharge status: Home / Self Care | Payer: Self-pay | Attending: Emergency Medicine | Admitting: Emergency Medicine

## 2023-08-22 DIAGNOSIS — K219 Gastro-esophageal reflux disease without esophagitis: Secondary | ICD-10-CM | POA: Insufficient documentation

## 2023-08-22 MED ORDER — PANTOPRAZOLE SODIUM 20 MG PO TBEC: 20.0000 mg | DELAYED_RELEASE_TABLET | Freq: Every day | ORAL | 0 refills | Status: AC

## 2023-08-22 NOTE — ED Provider Notes (Signed)
[place] EMERGENCY DEPARTMENT AT [facility] Provider Note   CSN: [id number] Arrival date & time: 08/22/23  1905     History  Chief Complaint  Patient presents with   Oral Swelling    Dennis Phillips is a 22 y.o. male presents with concern for intermittent pain in his throat, more so on the right side, that has been ongoing for couple months.  States the pain typically happens at night or first thing in the morning.  Reports he does lay down often right after eating at night.  The pain is described as a burning sensation and can be accompanied with feelings of regurgitation.  Denies any fevers, chills, recent dental work.  HPI     Home Medications Prior to Admission medications   Medication Sig Start Date End Date Taking? Authorizing Provider  pantoprazole (PROTONIX) 20 MG tablet Take 1 tablet (20 mg total) by mouth daily for 28 days. 08/22/23 09/19/23 Yes Arabella Merles, PA-C  HYDROcodone-acetaminophen (NORCO/VICODIN) 5-325 MG tablet Take 1 tablet by mouth every 6 (six) hours as needed for severe pain or moderate pain. 06/18/22   Fayrene Helper, PA-C  ibuprofen (ADVIL,MOTRIN) 200 MG tablet Take 200 mg by mouth every 6 (six) hours as needed for pain.    [provider]  ondansetron (ZOFRAN) 4 MG tablet Take 1 tablet (4 mg total) by mouth every 8 (eight) hours as needed for nausea or vomiting. 06/18/22   Fayrene Helper, PA-C  tamsulosin (FLOMAX) 0.4 MG CAPS capsule Take 1 capsule (0.4 mg total) by mouth daily. 06/18/22   Fayrene Helper, PA-C      Allergies    Patient has no known allergies.    Review of Systems   Review of Systems  Constitutional:  Negative for fever.    Physical Exam Updated Vital Signs BP 133/76 (BP Location: Right Arm)   Pulse (!) 58   Temp 98 F (36.7 C) (Oral)   Resp 18   Ht 6\' 1"  (1.854 m)   Wt 81.6 kg   SpO2 100%   BMI 23.75 kg/m  Physical Exam Vitals and nursing note reviewed.  Constitutional:      Appearance: Normal  appearance.     Comments: Well appearing, talking comfortably and drinking tea   HENT:     Head: Atraumatic.     Mouth/Throat:     Pharynx: No oropharyngeal exudate or posterior oropharyngeal erythema.     Comments: Tonsils 2+ bilaterally No abscesses  Neck:     Comments: No lymphadenopathy Pulmonary:     Effort: Pulmonary effort is normal.  Musculoskeletal:     Cervical back: Neck supple.  Neurological:     General: No focal deficit present.     Mental Status: He is alert.  Psychiatric:        Mood and Affect: Mood normal.        Behavior: Behavior normal.     ED Results / Procedures / Treatments   Labs (all labs ordered are listed, but only abnormal results are displayed) Labs Reviewed - No data to display  EKG None  Radiology No results found.  Procedures Procedures    Medications Ordered in ED Medications - No data to display  ED Course/ Medical Decision Making/ A&P                                 Medical Decision Making    Differential diagnosis includes  but is not limited to GERD, peritonsillar abscess, strep, viral URI  ED Course:  Patient well-appearing, stable vital signs.  States he has been having throat pain intermittently for the past couple months.  Pain occurs usually after laying down/first thing in the morning.  Feels like a burning sensation in the back of his throat.  His physical exam is unremarkable.  No recent dental work, no abscesses noted, no fevers or tachycardia, no concern for infection at this time.  The description of pain sounds possibly like acid reflux.     Impression: Intermittent throat pain likely secondary to acid reflux  Disposition:  The patient was discharged home. I discussed lifestyle modifications such as staying up for at least 30 minutes after eating, avoiding spicy or acidic foods with the patient.  We will also try for a course of Protonix for symptoms. Rx sent. Patient stable appropriate for discharge at this  time.  Instructed to follow-up with PCP or ENT within the next month for further evaluation. ENT contact information provided Return precautions given.              Final Clinical Impression(s) / ED Diagnoses Final diagnoses:  Gastroesophageal reflux disease, unspecified whether esophagitis present    Rx / DC Orders ED Discharge Orders          Ordered    pantoprazole (PROTONIX) 20 MG tablet  Daily        08/22/23 1944              Arabella Merles, Cordelia Poche 08/22/23 1949    Lonell Grandchild, MD 08/22/23 2243

## 2023-08-22 NOTE — ED Triage Notes (Signed)
Pt reports:  Left tonsil pain Ongoing for months Swelling always present Pain comes and goes

## 2023-08-22 NOTE — Discharge Instructions (Addendum)
The pain sensation in your throat may be due to acid reflux.  Please reduce your intake of acidic foods (such as tomatoes, lemon), spicy foods, caffeine, as these can aggravate acid reflux.  Remain sitting upright for at least 30 minutes after eating eating a meal.  Please take 20 mg of pantoprazole (Protonix) daily for the next 4 weeks. I have sent this prescription into your pharmacy.  Please follow up with your PCP or the ENT office below if symptoms not improving over the next month with these modifications.  Return to the ER for any unexplained fevers, inability to swallow, any other new or concerning symptoms

## 2023-10-07 ENCOUNTER — Emergency Department (HOSPITAL_COMMUNITY)
Admission: EM | Admit: 2023-10-07 | Discharge: 2023-10-07 | Disposition: A | Discharge status: Home / Self Care | Payer: Self-pay | Attending: Emergency Medicine | Admitting: Emergency Medicine

## 2023-10-07 DIAGNOSIS — S01511A Laceration without foreign body of lip, initial encounter: Secondary | ICD-10-CM | POA: Insufficient documentation

## 2023-10-07 DIAGNOSIS — Y9367 Activity, basketball: Secondary | ICD-10-CM | POA: Insufficient documentation

## 2023-10-07 DIAGNOSIS — S0181XA Laceration without foreign body of other part of head, initial encounter: Secondary | ICD-10-CM

## 2023-10-07 DIAGNOSIS — W500XXA Accidental hit or strike by another person, initial encounter: Secondary | ICD-10-CM | POA: Insufficient documentation

## 2023-10-07 DIAGNOSIS — Z23 Encounter for immunization: Secondary | ICD-10-CM | POA: Insufficient documentation

## 2023-10-07 MED ORDER — TETANUS-DIPHTH-ACELL PERTUSSIS 5-2.5-18.5 LF-MCG/0.5 IM SUSY
0.5000 mL | PREFILLED_SYRINGE | Freq: Once | INTRAMUSCULAR | Status: AC
  Administered 2023-10-07: 0.5 mL via INTRAMUSCULAR
  Filled 2023-10-07: qty 0.5

## 2023-10-07 MED ORDER — LIDOCAINE HCL (PF) 1 % IJ SOLN
10.0000 mL | Freq: Once | INTRAMUSCULAR | Status: AC
  Administered 2023-10-07: 10 mL
  Filled 2023-10-07: qty 10

## 2023-10-07 NOTE — Discharge Instructions (Signed)
 Follow up in 5-7 days for suture removal. You have 4 sutures.  Apply antibiotic ointment twice a day (Neosporin or bacitracin). Keep wound clean and dry.  Do not submerge in water however you can gently cleanse with soap and water. Return to emergency room if you have signs of infection. Have sutures removed emergency room or urgent care.

## 2023-10-07 NOTE — ED Provider Notes (Signed)
 [place] EMERGENCY DEPARTMENT AT [facility] Provider Note   CSN: [id number] Arrival date & time: 10/07/23  1710     History  Chief Complaint  Patient presents with   Lip Laceration    Dennis Phillips is a 23 y.o. male without significant past medical history reporting to the emergency room after getting hit in the face playing basketball.  Patient reports he has focal pain over right upper lip.  He has noted bleeding and laceration.  He does not know when his last tetanus shot was.  Patient reports he thinks he got elbowed into the face.  Denies any headache, jaw pain, loose teeth, change in vision or blurry vision.  During incident patient did not pass out or lose consciousness.   HPI     Home Medications Prior to Admission medications   Medication Sig Start Date End Date Taking? Authorizing Provider  HYDROcodone -acetaminophen  (NORCO/VICODIN) 5-325 MG tablet Take 1 tablet by mouth every 6 (six) hours as needed for severe pain or moderate pain. 06/18/22   Nivia Colon, PA-C  ibuprofen  (ADVIL ,MOTRIN ) 200 MG tablet Take 200 mg by mouth every 6 (six) hours as needed for pain.    [provider]  ondansetron  (ZOFRAN ) 4 MG tablet Take 1 tablet (4 mg total) by mouth every 8 (eight) hours as needed for nausea or vomiting. 06/18/22   Nivia Colon, PA-C  pantoprazole  (PROTONIX ) 20 MG tablet Take 1 tablet (20 mg total) by mouth daily for 28 days. 08/22/23 09/19/23  Veta Palma, PA-C  tamsulosin  (FLOMAX ) 0.4 MG CAPS capsule Take 1 capsule (0.4 mg total) by mouth daily. 06/18/22   Nivia Colon, PA-C      Allergies    Patient has no known allergies.    Review of Systems   Review of Systems  Skin:  Positive for wound.    Physical Exam Updated Vital Signs BP 120/80 (BP Location: Right Arm)   Pulse 79   Temp 98.6 F (37 C) (Oral)   Resp 18   SpO2 91%  Physical Exam Vitals and nursing note reviewed.  Constitutional:      General: He is not in acute distress.     Appearance: He is not toxic-appearing.  HENT:     Head: Normocephalic and atraumatic.  Eyes:     General: No scleral icterus.    Conjunctiva/sclera: Conjunctivae normal.  Cardiovascular:     Rate and Rhythm: Normal rate and regular rhythm.     Pulses: Normal pulses.     Heart sounds: Normal heart sounds.  Pulmonary:     Effort: Pulmonary effort is normal. No respiratory distress.     Breath sounds: Normal breath sounds.  Abdominal:     General: Abdomen is flat. Bowel sounds are normal.     Palpations: Abdomen is soft.     Tenderness: There is no abdominal tenderness.  Skin:    General: Skin is warm and dry.     Findings: No lesion.     Comments: No laceration through vermilion border approximately 1 cm, mild bleeding.   Neurological:     General: No focal deficit present.     Mental Status: He is alert and oriented to person, place, and time. Mental status is at baseline.     ED Results / Procedures / Treatments   Labs (all labs ordered are listed, but only abnormal results are displayed) Labs Reviewed - No data to display  EKG None  Radiology No results found.  Procedures .Laceration  Repair  Date/Time: 10/07/2023 6:30 PM  Performed by: Shermon Warren SAILOR, PA-C Authorized by: Shermon Warren SAILOR, PA-C   Consent:    Consent obtained:  Verbal   Consent given by:  Patient   Risks, benefits, and alternatives were discussed: yes     Risks discussed:  Infection, need for additional repair, nerve damage, poor wound healing, poor cosmetic result, pain, retained foreign body, tendon damage and vascular damage   Alternatives discussed:  No treatment Universal protocol:    Procedure explained and questions answered to patient or proxy's satisfaction: yes     Relevant documents present and verified: yes     Test results available: yes     Imaging studies available: yes     Required blood products, implants, devices, and special equipment available: yes     Site/side marked: yes      Immediately prior to procedure, a time out was called: yes     Patient identity confirmed:  Verbally with patient Anesthesia:    Anesthesia method:  Local infiltration   Local anesthetic:  Lidocaine  1% w/o epi Laceration details:    Location:  Lip   Lip location:  Upper exterior lip   Length (cm):  1 Pre-procedure details:    Preparation:  Patient was prepped and draped in usual sterile fashion Exploration:    Hemostasis achieved with:  Direct pressure Treatment:    Area cleansed with:  Povidone-iodine   Amount of cleaning:  Standard   Irrigation method:  Pressure wash   Debridement:  None Skin repair:    Repair method:  Sutures   Suture size:  5-0   Suture material:  Nylon   Suture technique:  Simple interrupted Approximation:    Approximation:  Close   Vermilion border well-aligned: yes   Repair type:    Repair type:  Simple Post-procedure details:    Dressing:  Open (no dressing)   Procedure completion:  Tolerated     Medications Ordered in ED Medications  lidocaine  (PF) (XYLOCAINE ) 1 % injection 10 mL (10 mLs Other Given 10/07/23 1746)    ED Course/ Medical Decision Making/ A&P                                 Medical Decision Making Risk Prescription drug management.   Worth JONETTA Lesches 23 y.o. presented today for a 1 cm laceration to their lip. Working Ddx: FB, fracture, NV compromise, simple laceration.  R/o DDx: These ddx are considered less likely due to history of present illness and physical exam findings.  Pmhx considered:   Patient presented for a 1 cm laceration to their lip. They are neurovascularly intact. Tetanus is not UTD. Patient is in no distress. Laceration will be repaired with standard wound care procedures and antibiotic ointment.   Review of prior external notes: none  Labs: none  Imaging: none   Problem List / ED Course / Critical interventions / Medication management  Patient reporting with laceration.  He was playing  basketball elbowed into the lip.  He appears atraumatic, he is answering questions appropriately with normal vital signs.  He has no other injuries or no other complaints. Does have small laceration.  Will update tetanus shot and repair laceration.  Physical exam reassuring without any other acute abnormality. I ordered medication including lidocaine  for local numbing  Reevaluation of the patient after these medicines showed that the patient improved Patients vitals assessed. Upon arrival  patient is hemodynamically stable.  I have reviewed the patients home medicines and have made adjustments as needed  Consult: none    Plan:  F/u for suture removal in 5-7 days.  Patient is stable for discharge at this time. Patient/family expressed understanding of return precautions and need for follow-up.  Keep wound clean, covered. Educated on sx of concern regarding complications -- such as infection, poor wound healing, compartment sx.          Final Clinical Impression(s) / ED Diagnoses Final diagnoses:  Facial laceration, initial encounter    Rx / DC Orders ED Discharge Orders     None         Shermon Warren SAILOR, PA-C 10/07/23 2034    Elnor Savant A, DO 10/08/23 1542

## 2023-10-07 NOTE — ED Triage Notes (Signed)
 Pt states that he was playing basketball and caught an accidental elbow to the face. Pt has laceration to upper lip on the R side. Pt denies any loose teeth. No other injuries per pt.

## 2023-10-16 ENCOUNTER — Encounter (HOSPITAL_COMMUNITY): Payer: Self-pay

## 2023-10-16 ENCOUNTER — Emergency Department (HOSPITAL_COMMUNITY)
Admission: EM | Admit: 2023-10-16 | Discharge: 2023-10-16 | Disposition: A | Discharge status: Home / Self Care | Payer: Self-pay | Attending: Emergency Medicine | Admitting: Emergency Medicine

## 2023-10-16 ENCOUNTER — Other Ambulatory Visit: Payer: Self-pay

## 2023-10-16 DIAGNOSIS — Z4802 Encounter for removal of sutures: Secondary | ICD-10-CM | POA: Insufficient documentation

## 2023-10-16 NOTE — ED Provider Notes (Signed)
[place] EMERGENCY DEPARTMENT AT [facility] Provider Note   CSN: [id number] Arrival date & time: 10/16/23  7829     History  Chief Complaint  Patient presents with   Suture / Staple Removal    Dennis Phillips is a 23 y.o. male.   Suture / Staple Removal Pertinent negatives include no headaches.       Dennis Phillips is a 23 y.o. male who presents to the Emergency Department questing suture removal from his right upper lip.  Sutures were placed on 10/07/2023 he denies any complications or difficulty with the sutures.  He states that 1 suture came out yesterday while playing basketball.  He denies any bleeding or swelling to this lip denies any pain.    Home Medications Prior to Admission medications   Medication Sig Start Date End Date Taking? Authorizing Provider  HYDROcodone-acetaminophen (NORCO/VICODIN) 5-325 MG tablet Take 1 tablet by mouth every 6 (six) hours as needed for severe pain or moderate pain. 06/18/22   Fayrene Helper, PA-C  ibuprofen (ADVIL,MOTRIN) 200 MG tablet Take 200 mg by mouth every 6 (six) hours as needed for pain.    [provider]  ondansetron (ZOFRAN) 4 MG tablet Take 1 tablet (4 mg total) by mouth every 8 (eight) hours as needed for nausea or vomiting. 06/18/22   Fayrene Helper, PA-C  pantoprazole (PROTONIX) 20 MG tablet Take 1 tablet (20 mg total) by mouth daily for 28 days. 08/22/23 09/19/23  Arabella Merles, PA-C  tamsulosin (FLOMAX) 0.4 MG CAPS capsule Take 1 capsule (0.4 mg total) by mouth daily. 06/18/22   Fayrene Helper, PA-C      Allergies    Patient has no known allergies.    Review of Systems   Review of Systems  Constitutional:  Negative for appetite change, chills and fever.  Skin:  Negative for color change and rash.  Neurological:  Negative for dizziness, weakness, numbness and headaches.    Physical Exam Updated Vital Signs BP 120/68 (BP Location: Right Arm)   Pulse 78   Temp 98.5 F (36.9 C) (Oral)   Resp 16    Ht 6\' 1"  (1.854 m)   Wt 81.6 kg   SpO2 99%   BMI 23.75 kg/m  Physical Exam Vitals and nursing note reviewed.  Constitutional:      General: He is not in acute distress.    Appearance: Normal appearance.  HENT:     Mouth/Throat:     Mouth: Mucous membranes are moist.     Pharynx: Oropharynx is clear.     Comments: 3 sutures in place right upper lip.  Appear to be well-healed.  No wound dehiscence, edema, erythema or swelling to the area. Cardiovascular:     Rate and Rhythm: Normal rate and regular rhythm.     Pulses: Normal pulses.  Pulmonary:     Effort: Pulmonary effort is normal.  Musculoskeletal:        General: Normal range of motion.  Skin:    General: Skin is warm.     Capillary Refill: Capillary refill takes less than 2 seconds.  Neurological:     General: No focal deficit present.     Mental Status: He is alert.     Cranial Nerves: No cranial nerve deficit.     Coordination: Coordination normal.     ED Results / Procedures / Treatments   Labs (all labs ordered are listed, but only abnormal results are displayed) Labs Reviewed - No data to display  EKG None  Radiology No results found.  Procedures Procedures    Medications Ordered in ED Medications - No data to display  ED Course/ Medical Decision Making/ A&P                                 Medical Decision Making Patient here for suture removal.  Sutures were placed on 10/07/2023.  He denies any complications.    Medical record reviewed, he initially had 4 sutures placed but states that 1 spontaneously fell out yesterday.  He denies any swelling or wound dehiscence.  No pain of his lip or numbness.  Amount and/or Complexity of Data Reviewed Discussion of management or test interpretation with external provider(s): Sutures were removed easily by nursing staff.  No complications.  Patient to continue wound care instructions.           Final Clinical Impression(s) / ED Diagnoses Final  diagnoses:  Visit for suture removal    Rx / DC Orders ED Discharge Orders     None         Pauline Aus, PA-C 10/16/23 0810    Gloris Manchester, MD 10/17/23 [phone]

## 2023-10-16 NOTE — Discharge Instructions (Signed)
Return to the ER if needed. 

## 2023-10-16 NOTE — ED Triage Notes (Signed)
Patient got stitches to the top right lip a week ago and came in to get them removed.

## 2023-10-16 NOTE — ED Notes (Signed)
Three sutures removed. Provider aware.

## 2024-01-10 ENCOUNTER — Encounter (HOSPITAL_COMMUNITY): Payer: Self-pay | Admitting: Emergency Medicine

## 2024-01-10 ENCOUNTER — Other Ambulatory Visit: Payer: Self-pay

## 2024-01-10 ENCOUNTER — Emergency Department (HOSPITAL_COMMUNITY)
Admission: EM | Admit: 2024-01-10 | Discharge: 2024-01-10 | Disposition: A | Discharge status: Home / Self Care | Payer: Self-pay | Attending: Emergency Medicine | Admitting: Emergency Medicine

## 2024-01-10 DIAGNOSIS — Y9367 Activity, basketball: Secondary | ICD-10-CM | POA: Insufficient documentation

## 2024-01-10 DIAGNOSIS — S01511A Laceration without foreign body of lip, initial encounter: Secondary | ICD-10-CM | POA: Insufficient documentation

## 2024-01-10 DIAGNOSIS — W2105XA Struck by basketball, initial encounter: Secondary | ICD-10-CM | POA: Insufficient documentation

## 2024-01-10 MED ORDER — LIDOCAINE-EPINEPHRINE-TETRACAINE (LET) TOPICAL GEL
3.0000 mL | Freq: Once | TOPICAL | Status: AC
  Administered 2024-01-10: 3 mL via TOPICAL
  Filled 2024-01-10: qty 3

## 2024-01-10 NOTE — ED Triage Notes (Signed)
 Pt bib pov from the [facility] w/ c/o a lip laceration. Pt was playing basketball and received an elbow to his mouth. Pt presents with a laceration to his left upper lip and a crack in the front left tooth. Pt rates pain 6/10. Pt did not LOC or have any blows to the head. Pt is A&O X4

## 2024-01-10 NOTE — ED Notes (Signed)
 Pt had shot for a similar event in January

## 2024-01-10 NOTE — Discharge Instructions (Signed)
 Sutures will need to be moved in 4 to 5 days.  Gentle soap and water.  Avoid sun exposure.  Return sooner if any signs of infection.  Follow-up with your dentist regarding possible dental injury

## 2024-01-10 NOTE — ED Provider Notes (Signed)
  EMERGENCY DEPARTMENT AT [facility] Provider Note   CSN: [id number] Arrival date & time: 01/10/24  0730     History  Chief Complaint  Patient presents with   Lip Laceration    Dennis Phillips is a 23 y.o. male.  He is here with a complaint of left upper lip laceration he sustained while playing basketball and caught an elbow to the face.  He also has a little bit of a crack in his left upper central incisor.  No other injuries or complaints.  No loss of consciousness.  Occurred just prior to arrival.  Tetanus is up-to-date  The history is provided by the patient.  Facial Injury Mechanism of injury:  Direct blow Location:  Mouth Mouth location:  Lip(s) and teeth Tooth location:  9/LU central incisor Time since incident:  1 hour Pain details:    Severity:  Mild Associated symptoms: no altered mental status, no difficulty breathing, no double vision and no epistaxis        Home Medications Prior to Admission medications   Medication Sig Start Date End Date Taking? Authorizing Provider  HYDROcodone -acetaminophen  (NORCO/VICODIN) 5-325 MG tablet Take 1 tablet by mouth every 6 (six) hours as needed for severe pain or moderate pain. 06/18/22   Debbra Fairy, PA-C  ibuprofen  (ADVIL ,MOTRIN ) 200 MG tablet Take 200 mg by mouth every 6 (six) hours as needed for pain.    [provider]  ondansetron  (ZOFRAN ) 4 MG tablet Take 1 tablet (4 mg total) by mouth every 8 (eight) hours as needed for nausea or vomiting. 06/18/22   Debbra Fairy, PA-C  pantoprazole  (PROTONIX ) 20 MG tablet Take 1 tablet (20 mg total) by mouth daily for 28 days. 08/22/23 09/19/23  Rexie Catena, PA-C  tamsulosin  (FLOMAX ) 0.4 MG CAPS capsule Take 1 capsule (0.4 mg total) by mouth daily. 06/18/22   Debbra Fairy, PA-C      Allergies    Patient has no known allergies.    Review of Systems   Review of Systems  HENT:  Negative for nosebleeds.   Eyes:  Negative for double vision.     Physical Exam Updated Vital Signs BP 132/85 (BP Location: Left Arm)   Pulse 65   Temp 98 F (36.7 C) (Oral)   Resp 16   Ht 6\' 1"  (1.854 m)   Wt 81.6 kg   SpO2 98%   BMI 23.75 kg/m  Physical Exam Vitals and nursing note reviewed.  Constitutional:      Appearance: Normal appearance. He is well-developed.  HENT:     Head: Normocephalic.     Nose: Nose normal.     Mouth/Throat:     Mouth: Mucous membranes are moist.     Pharynx: Oropharynx is clear.     Comments: He has approximately 1 cm laceration left upper lip not crossing the vermilion border.  He has a small chip out of his upper incisor.  No bleeding.  No trismus or malocclusion Eyes:     Conjunctiva/sclera: Conjunctivae normal.  Pulmonary:     Effort: Pulmonary effort is normal.  Musculoskeletal:     Cervical back: Neck supple.  Skin:    General: Skin is warm and dry.  Neurological:     Mental Status: He is alert.     GCS: GCS eye subscore is 4. GCS verbal subscore is 5. GCS motor subscore is 6.     ED Results / Procedures / Treatments   Labs (all labs ordered are listed,  but only abnormal results are displayed) Labs Reviewed - No data to display  EKG None  Radiology No results found.  Procedures .Laceration Repair  Date/Time: 01/10/2024 9:48 AM  Performed by: Tonya Fredrickson, MD Authorized by: Tonya Fredrickson, MD   Consent:    Consent obtained:  Verbal   Consent given by:  Patient   Risks, benefits, and alternatives were discussed: yes     Risks discussed:  Infection, nerve damage, poor wound healing, pain, retained foreign body, tendon damage and vascular damage   Alternatives discussed:  No treatment, delayed treatment and referral Universal protocol:    Procedure explained and questions answered to patient or proxy's satisfaction: yes     Patient identity confirmed:  Verbally with patient Anesthesia:    Anesthesia method:  Topical application   Topical anesthetic:  LET Laceration  details:    Location:  Lip   Lip location:  Upper exterior lip   Length (cm):  2 Pre-procedure details:    Preparation:  Patient was prepped and draped in usual sterile fashion Treatment:    Amount of cleaning:  Standard   Irrigation solution:  Sterile saline Skin repair:    Repair method:  Sutures   Suture size:  6-0   Suture material:  Prolene   Suture technique:  Simple interrupted   Number of sutures:  3 Approximation:    Approximation:  Close   Vermilion border well-aligned: yes   Repair type:    Repair type:  Simple Post-procedure details:    Dressing:  Open (no dressing)   Procedure completion:  Tolerated well, no immediate complications     Medications Ordered in ED Medications  lidocaine -EPINEPHrine-tetracaine (LET) topical gel (has no administration in time range)    ED Course/ Medical Decision Making/ A&P                                 Medical Decision Making  This patient complains of mouth trauma lip lack; this involves an extensive number of treatment Options and is a complaint that carries with it a high risk of complications and morbidity. The differential includes laceration, fracture, hematoma  Previous records obtained and reviewed in epic no recent admissions Social determinants considered, no significant barriers Critical Interventions: None  After the interventions stated above, I reevaluated the patient and found patient's symptoms to be improved Admission and further testing considered, recommended patient follow-up with dentist and will need to return for suture removal.  Return instructions discussed.         Final Clinical Impression(s) / ED Diagnoses Final diagnoses:  Lip laceration, initial encounter    Rx / DC Orders ED Discharge Orders     None         Tonya Fredrickson, MD 01/10/24 [phone]

## 2024-01-24 ENCOUNTER — Emergency Department (HOSPITAL_COMMUNITY)
Admission: EM | Admit: 2024-01-24 | Discharge: 2024-01-24 | Disposition: A | Discharge status: Home / Self Care | Payer: Self-pay | Attending: Emergency Medicine | Admitting: Emergency Medicine

## 2024-01-24 ENCOUNTER — Other Ambulatory Visit: Payer: Self-pay

## 2024-01-24 ENCOUNTER — Encounter (HOSPITAL_COMMUNITY): Payer: Self-pay | Admitting: *Deleted

## 2024-01-24 DIAGNOSIS — Z4802 Encounter for removal of sutures: Secondary | ICD-10-CM | POA: Insufficient documentation

## 2024-01-24 NOTE — ED Provider Notes (Signed)
  EMERGENCY DEPARTMENT AT [facility] Provider Note   CSN: [id number] Arrival date & time: 01/24/24  4098     History  Chief Complaint  Patient presents with   Suture / Staple Removal    Dennis Phillips is a 23 y.o. male with no significant past medical history presenting for evaluation of suture removal.  He was seen here on May 14 for suture placement of his left upper lip, injury sustained while playing basketball.  Denies any complaint with his wound.  His left upper central incisor was also injured he has seen his dentist and is planning to recheck in 2 weeks as it appears to be stabilizing on its own.   The history is provided by the patient.       Home Medications Prior to Admission medications   Medication Sig Start Date End Date Taking? Authorizing Provider  HYDROcodone -acetaminophen  (NORCO/VICODIN) 5-325 MG tablet Take 1 tablet by mouth every 6 (six) hours as needed for severe pain or moderate pain. 06/18/22   Debbra Fairy, PA-C  ibuprofen  (ADVIL ,MOTRIN ) 200 MG tablet Take 200 mg by mouth every 6 (six) hours as needed for pain.    [provider]  ondansetron  (ZOFRAN ) 4 MG tablet Take 1 tablet (4 mg total) by mouth every 8 (eight) hours as needed for nausea or vomiting. 06/18/22   Debbra Fairy, PA-C  pantoprazole  (PROTONIX ) 20 MG tablet Take 1 tablet (20 mg total) by mouth daily for 28 days. 08/22/23 09/19/23  Rexie Catena, PA-C  tamsulosin  (FLOMAX ) 0.4 MG CAPS capsule Take 1 capsule (0.4 mg total) by mouth daily. 06/18/22   Debbra Fairy, PA-C      Allergies    Patient has no known allergies.    Review of Systems   Review of Systems  Constitutional:  Negative for chills and fever.  Skin:  Positive for wound.  All other systems reviewed and are negative.   Physical Exam Updated Vital Signs BP 120/60 (BP Location: Left Arm)   Pulse 72   Temp 98.4 F (36.9 C) (Oral)   Resp 16   Ht 6\' 1"  (1.854 m)   Wt 81.6 kg   SpO2 100%   BMI  23.73 kg/m  Physical Exam Constitutional:      Appearance: He is well-developed.  HENT:     Head: Normocephalic.     Mouth/Throat:     Comments: Left upper central incisor intact, gingival line is adhered, there is a small amount of movement in this tooth.  No discoloration. Cardiovascular:     Rate and Rhythm: Normal rate.  Pulmonary:     Effort: Pulmonary effort is normal.  Skin:    Findings: Laceration present.     Comments: Well-healed laceration left upper lip with 3 sutures in place.  Neurological:     Mental Status: He is alert and oriented to person, place, and time.     Sensory: No sensory deficit.     ED Results / Procedures / Treatments   Labs (all labs ordered are listed, but only abnormal results are displayed) Labs Reviewed - No data to display  EKG None  Radiology No results found.  Procedures Procedures    SUTURE REMOVAL Performed by: Katherine Pancake  Consent: Verbal consent obtained. Patient identity confirmed: provided demographic data Time out: Immediately prior to procedure a "time out" was called to verify the correct patient, procedure, equipment, support staff and site/side marked as required.  Location details: left upper lip  Wound Appearance:  clean  Sutures/Staples Removed: 3  Facility: sutures placed in this facility Patient tolerance: Patient tolerated the procedure well with no immediate complications.   Medications Ordered in ED Medications - No data to display  ED Course/ Medical Decision Making/ A&P                                 Medical Decision Making Patient here for simple suture removal which he tolerated well.           Final Clinical Impression(s) / ED Diagnoses Final diagnoses:  Visit for suture removal    Rx / DC Orders ED Discharge Orders     None         [name] July 01/24/24 1037    Mordecai Applebaum, MD 01/24/24 1133

## 2024-01-24 NOTE — ED Triage Notes (Signed)
 Pt here to have stitches removed from lip  Pt denies any pain

## 2024-01-24 NOTE — Discharge Instructions (Signed)
 Your wound appears well healed.  Follow up with your dentist as needed.
